# Patient Record
Sex: Male | Born: 1960
Health system: Southern US, Community
[De-identification: ages and names within clinical notes are randomized; demographics above are authoritative.]

## PROBLEM LIST (undated history)

## (undated) DIAGNOSIS — J449 Chronic obstructive pulmonary disease, unspecified: Secondary | ICD-10-CM

## (undated) DIAGNOSIS — IMO0001 Reserved for inherently not codable concepts without codable children: Secondary | ICD-10-CM

## (undated) DIAGNOSIS — J45909 Unspecified asthma, uncomplicated: Secondary | ICD-10-CM

## (undated) DIAGNOSIS — L739 Follicular disorder, unspecified: Secondary | ICD-10-CM

## (undated) DIAGNOSIS — R7303 Prediabetes: Secondary | ICD-10-CM

## (undated) HISTORY — DX: Prediabetes: R73.03

## (undated) HISTORY — DX: Follicular disorder, unspecified: L73.9

## (undated) HISTORY — PX: CHOLECYSTECTOMY: SHX55

---

## 2005-04-15 ENCOUNTER — Emergency Department (HOSPITAL_COMMUNITY): Admission: EM | Admit: 2005-04-15 | Discharge: 2005-04-15 | Payer: Self-pay | Admitting: Emergency Medicine

## 2006-11-11 ENCOUNTER — Emergency Department (HOSPITAL_COMMUNITY): Admission: EM | Admit: 2006-11-11 | Discharge: 2006-11-11 | Payer: Self-pay | Admitting: Emergency Medicine

## 2006-11-11 ENCOUNTER — Ambulatory Visit (HOSPITAL_COMMUNITY): Admission: RE | Admit: 2006-11-11 | Discharge: 2006-11-11 | Payer: Self-pay | Admitting: Emergency Medicine

## 2011-05-28 ENCOUNTER — Encounter (HOSPITAL_COMMUNITY): Payer: Self-pay | Admitting: Radiology

## 2011-05-28 ENCOUNTER — Emergency Department (HOSPITAL_COMMUNITY)
Admission: EM | Admit: 2011-05-28 | Discharge: 2011-05-28 | Disposition: A | Discharge status: Home / Self Care | Payer: BC Managed Care – PPO | Attending: Emergency Medicine | Admitting: Emergency Medicine

## 2011-05-28 ENCOUNTER — Emergency Department (HOSPITAL_COMMUNITY): Payer: BC Managed Care – PPO

## 2011-05-28 DIAGNOSIS — K429 Umbilical hernia without obstruction or gangrene: Secondary | ICD-10-CM | POA: Insufficient documentation

## 2011-05-28 DIAGNOSIS — K5289 Other specified noninfective gastroenteritis and colitis: Secondary | ICD-10-CM | POA: Insufficient documentation

## 2011-05-28 DIAGNOSIS — R9389 Abnormal findings on diagnostic imaging of other specified body structures: Secondary | ICD-10-CM | POA: Insufficient documentation

## 2011-05-28 DIAGNOSIS — R1033 Periumbilical pain: Secondary | ICD-10-CM | POA: Insufficient documentation

## 2011-05-28 DIAGNOSIS — R112 Nausea with vomiting, unspecified: Secondary | ICD-10-CM | POA: Insufficient documentation

## 2011-05-28 LAB — CBC
HCT: 47.7 % (ref 39.0–52.0)
MCH: 33 pg (ref 26.0–34.0)
MCHC: 36.7 g/dL — ABNORMAL HIGH (ref 30.0–36.0)
MCV: 90 fL (ref 78.0–100.0)
RBC: 5.3 MIL/uL (ref 4.22–5.81)
WBC: 8.4 10*3/uL (ref 4.0–10.5)

## 2011-05-28 LAB — POCT I-STAT, CHEM 8
BUN: 12 mg/dL (ref 6–23)
Calcium, Ion: 1.13 mmol/L (ref 1.12–1.32)
Chloride: 104 mEq/L (ref 96–112)
TCO2: 24 mmol/L (ref 0–100)

## 2011-05-28 LAB — DIFFERENTIAL
Eosinophils Absolute: 0 10*3/uL (ref 0.0–0.7)
Eosinophils Relative: 0 % (ref 0–5)
Monocytes Absolute: 0.4 10*3/uL (ref 0.1–1.0)
Monocytes Relative: 4 % (ref 3–12)
Neutro Abs: 6.6 10*3/uL (ref 1.7–7.7)
Neutrophils Relative %: 78 % — ABNORMAL HIGH (ref 43–77)

## 2011-05-28 LAB — URINALYSIS, ROUTINE W REFLEX MICROSCOPIC
Glucose, UA: NEGATIVE mg/dL
Leukocytes, UA: NEGATIVE
Nitrite: NEGATIVE
Specific Gravity, Urine: 1.024 (ref 1.005–1.030)

## 2011-05-28 MED ORDER — IOHEXOL 300 MG/ML  SOLN
100.0000 mL | Freq: Once | INTRAMUSCULAR | Status: AC | PRN
  Administered 2011-05-28: 100 mL via INTRAVENOUS

## 2012-07-29 IMAGING — CT CT ABD-PELV W/ CM
2 of 5 series · 16 of 46 positions shown, 18 images · IV contrast (APPLIED)
Comparison: None.

***ADDENDUM*** CREATED: 05/28/2011 [DATE]

The original report was by Dr. Luis Aroldo Salameh.  The following
addendum is by Dr. Luis Aroldo Salameh:
Please note that in the fourth paragraph under the findings
section, the word "unlikely" was mistranscribed and should read
"likely."
Accordingly, that first sentence should read, "An enhancing left
mid-kidney mass measures 2.6 x 2.4 x 1.9 cm and is highly LIKELY to
represent a renal cell carcinoma."
ED personnel are aware.
CLINICAL DATA: Abdominal pain.  Nausea and green emesis.
CT ABDOMEN AND PELVIS WITH CONTRAST
TECHNIQUE: Multidetector CT imaging of the abdomen and pelvis was
performed following the standard protocol during bolus
administration of intravenous contrast.
Contrast: 100 ml 1mnipaque-3SS

[Series 2: abd/pelv with 5.0 b31f st · axial · 0.75mm/px · z∈[-456,-10]mm · 13 of 99 slices shown, 15 images]
[im 5/99  soft-tissue]
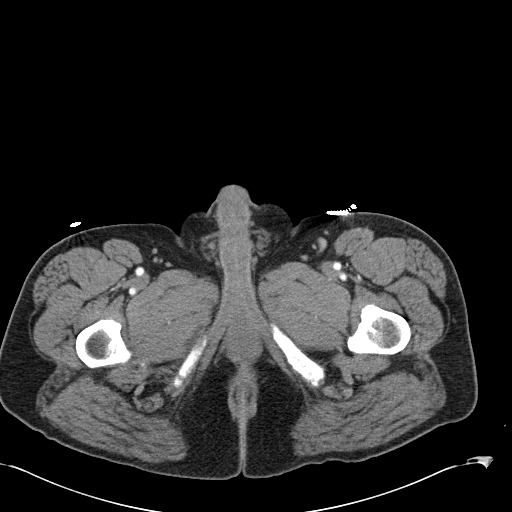
[im 5/99  bone]
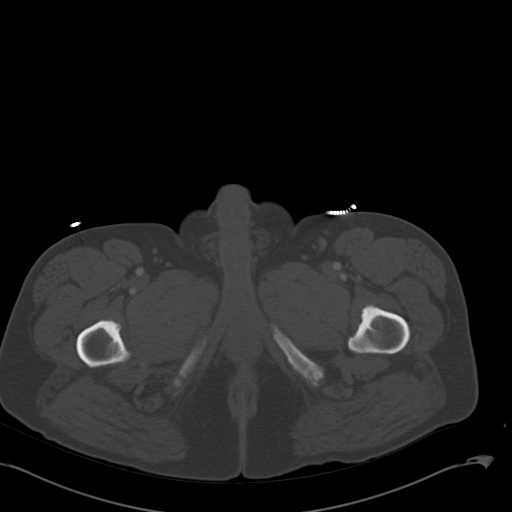
[im 15/99  soft-tissue]
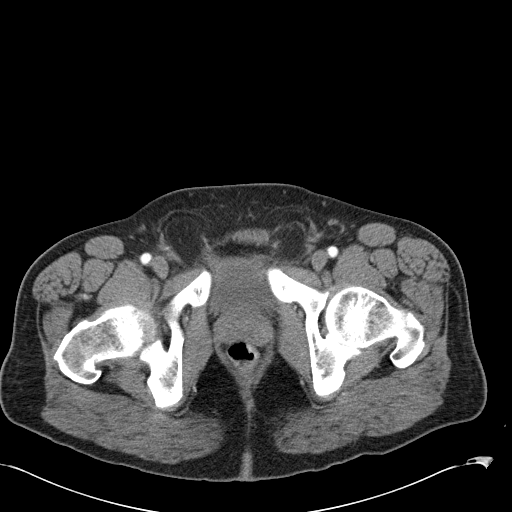
[im 20/99  soft-tissue]
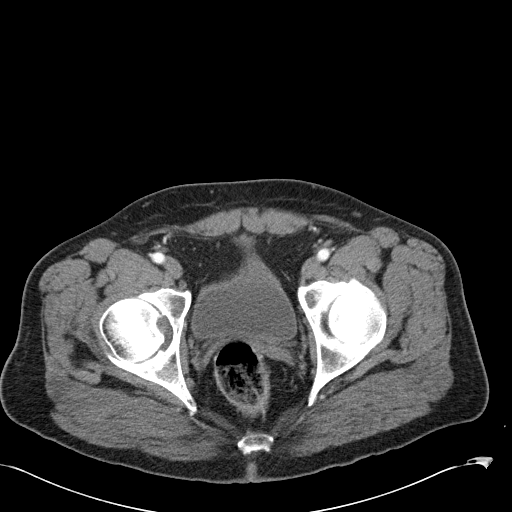
[im 30/99  soft-tissue]
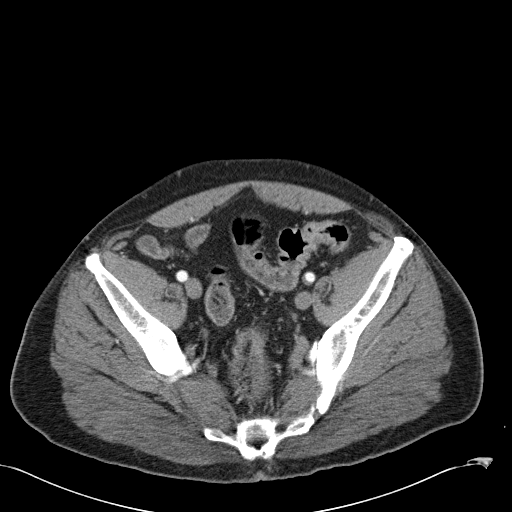
[im 35/99  soft-tissue]
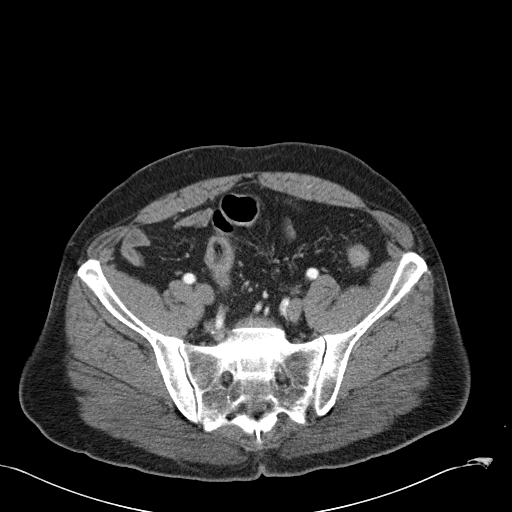
[im 45/99  soft-tissue]
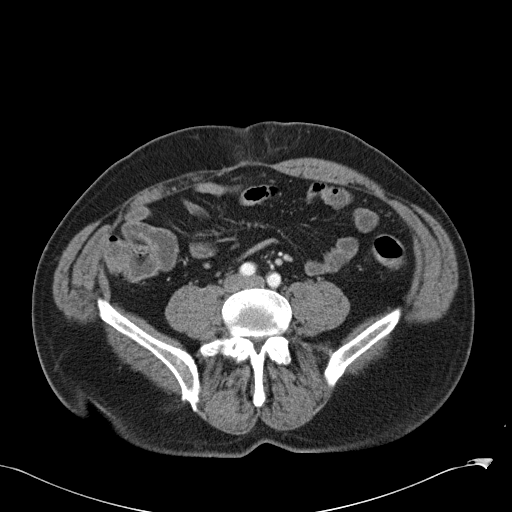
[im 50/99  soft-tissue]
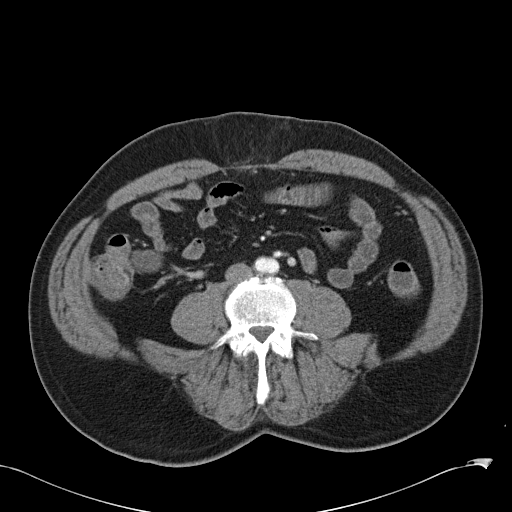
[im 54/99  soft-tissue]
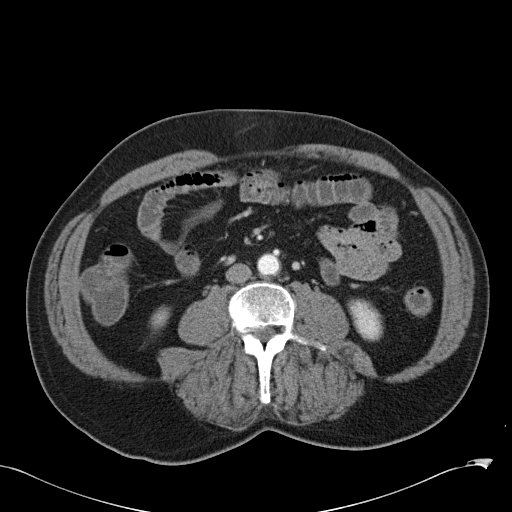
[im 64/99  soft-tissue]
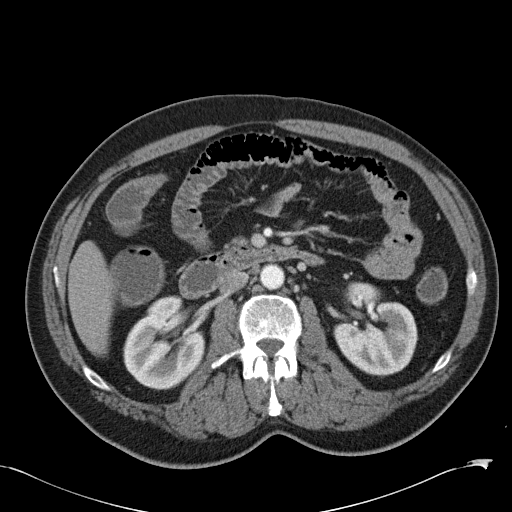
[im 64/99  bone]
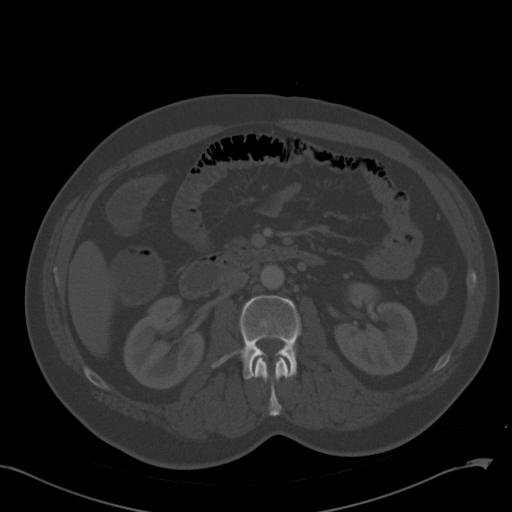
[im 69/99  soft-tissue]
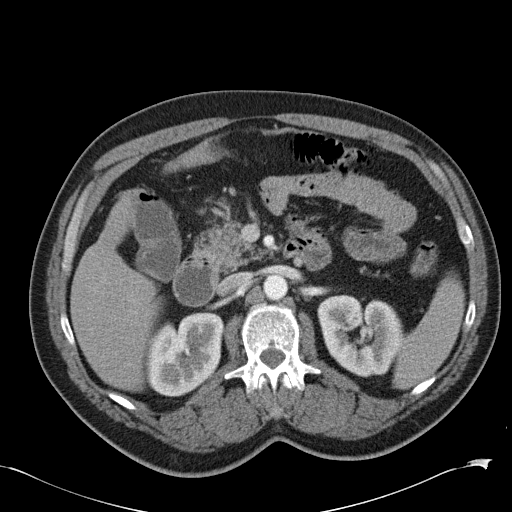
[im 79/99  soft-tissue]
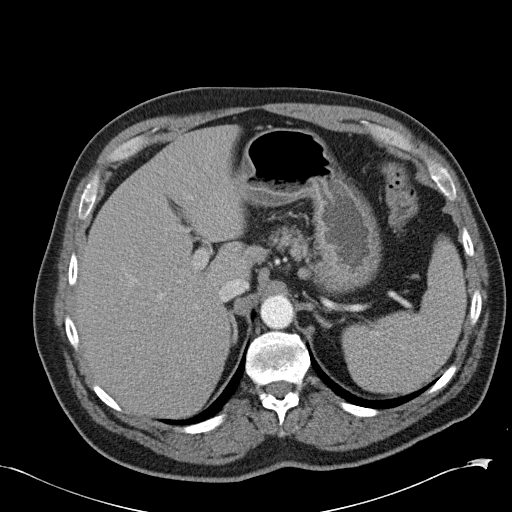
[im 84/99  soft-tissue]
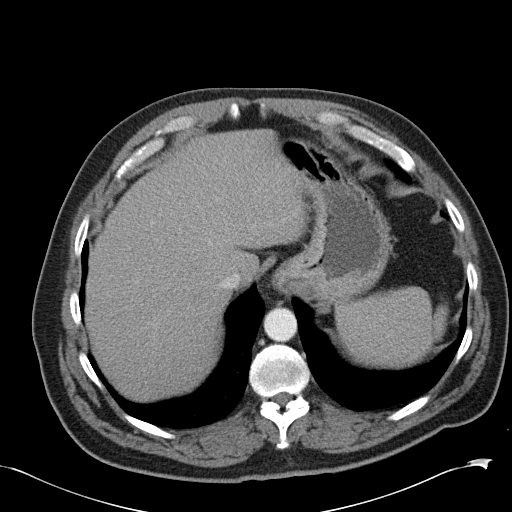
[im 94/99  soft-tissue]
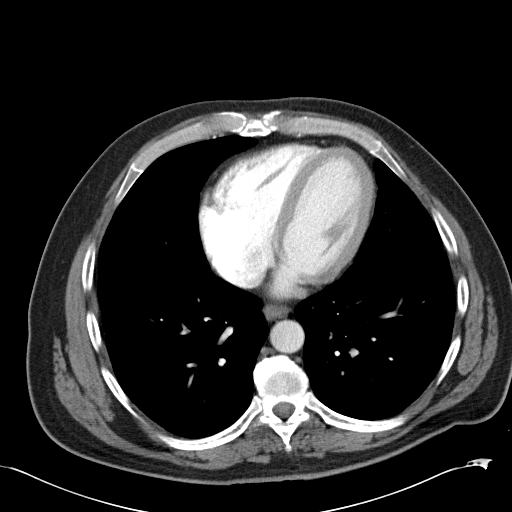

[Series 5: abd/pelv with 2.0 cor · coronal · 0.96mm/px · 3 of 127 slices shown]
[im 43/127  soft-tissue]
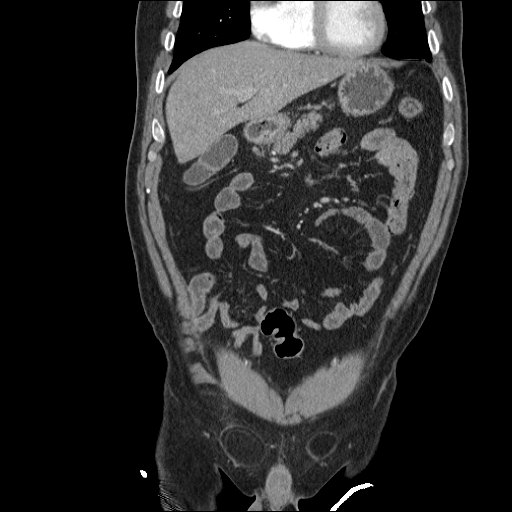
[im 57/127  soft-tissue]
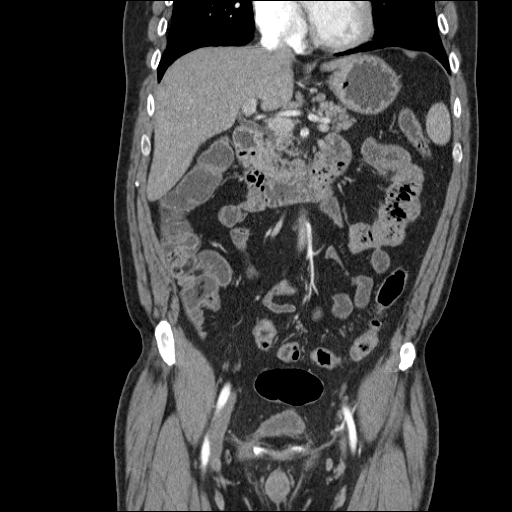
[im 71/127  soft-tissue]
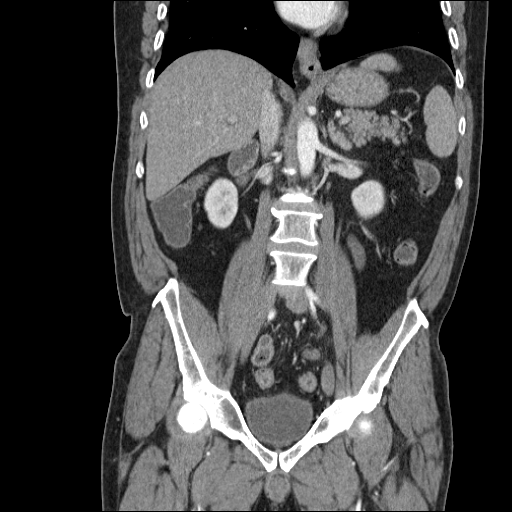

[16 of 46 positions shown; findings below may reference images not displayed]

FINDINGS: On the top most image there is a subtle suggestion of a 5
mm nodule in the right lower lobe.  There is also a suggestion of
faint tree in bud nodularity in the lower lobes favoring an
inflammatory etiology.

The liver, spleen, pancreas, and adrenal glands appear
unremarkable.

The gallbladder is surgically absent.

An enhancing left mid-kidney mass measures 2.6 x 2.4 by 1.9 cm and
is highly unlikely to represent a renal cell carcinoma. No
extension beyond the renal capsule is identified.  No pathologic
retroperitoneal adenopathy or tumor thrombus in the left renal vein
is identified.

The right kidney appears unremarkable.

A small gastrohepatic ligament node measures 0.9 cm on image 20 of
series 2.

A 6.5 cm supraumbilical hernia contains adipose tissue in the
midline.

Bilateral inguinal hernias contain adipose tissue.

No pathologic pelvic adenopathy is identified.

Urinary bladder appears unremarkable.  Mild proximal jejunal
dilatation is present, with scattered air-fluid levels and proximal
small bowel loops suggesting enteritis.

Posterior intervertebral spurring at all levels between L2 and L5
noted.
IMPRESSION: 1.  The finding of greatest concern is a enhancing left mid kidney
mass highly likely to represent stage I renal cell carcinoma.
Urology referral is recommended.
2.  There is some faint tree in bud nodularity in the lower lobes
favoring atypical infectious bronchiolitis.  I suspect that the 5
mm nodule partially visualized in the right lower lobe on the top
most image likely is also inflammatory, but given the renal lesion,
short-term follow-up CT the chest may be warranted.
3.  6.5 cm ventral hernia contains adipose tissue.
4.  Bilateral inguinal hernias also contain adipose tissue.
5.  Mild proximal small bowel dilatation with some mild mucosal
enhancement in the proximal small bowel favoring enteritis.

I discussed these findings, and in particular the suspected left
renal malignancy, with Dr. Stepan Lenard by telephone on 05/28/2011
at [DATE] p.m.

## 2013-09-10 ENCOUNTER — Encounter (HOSPITAL_COMMUNITY): Payer: Self-pay | Admitting: Emergency Medicine

## 2013-09-10 ENCOUNTER — Emergency Department (HOSPITAL_COMMUNITY)
Admission: EM | Admit: 2013-09-10 | Discharge: 2013-09-10 | Disposition: A | Discharge status: Home / Self Care | Payer: Self-pay | Attending: Emergency Medicine | Admitting: Emergency Medicine

## 2013-09-10 ENCOUNTER — Emergency Department (HOSPITAL_COMMUNITY): Payer: Self-pay

## 2013-09-10 DIAGNOSIS — Z79899 Other long term (current) drug therapy: Secondary | ICD-10-CM | POA: Insufficient documentation

## 2013-09-10 DIAGNOSIS — R05 Cough: Secondary | ICD-10-CM

## 2013-09-10 DIAGNOSIS — J209 Acute bronchitis, unspecified: Secondary | ICD-10-CM | POA: Insufficient documentation

## 2013-09-10 DIAGNOSIS — J40 Bronchitis, not specified as acute or chronic: Secondary | ICD-10-CM

## 2013-09-10 MED ORDER — AZITHROMYCIN 250 MG PO TABS
500.0000 mg | ORAL_TABLET | Freq: Once | ORAL | Status: AC
  Administered 2013-09-10: 500 mg via ORAL
  Filled 2013-09-10: qty 2

## 2013-09-10 MED ORDER — AZITHROMYCIN 250 MG PO TABS: 250.0000 mg | ORAL_TABLET | Freq: Every day | ORAL | Status: DC

## 2013-09-10 MED ORDER — ALBUTEROL SULFATE HFA 108 (90 BASE) MCG/ACT IN AERS
2.0000 | INHALATION_SPRAY | RESPIRATORY_TRACT | Status: DC | PRN
  Administered 2013-09-10: 2 via RESPIRATORY_TRACT
  Filled 2013-09-10: qty 6.7

## 2013-09-10 MED ORDER — IBUPROFEN 800 MG PO TABS
800.0000 mg | ORAL_TABLET | Freq: Once | ORAL | Status: AC
  Administered 2013-09-10: 800 mg via ORAL
  Filled 2013-09-10: qty 1

## 2013-09-10 MED ORDER — ALBUTEROL SULFATE (5 MG/ML) 0.5% IN NEBU
5.0000 mg | INHALATION_SOLUTION | Freq: Once | RESPIRATORY_TRACT | Status: AC
  Administered 2013-09-10: 5 mg via RESPIRATORY_TRACT
  Filled 2013-09-10: qty 1

## 2013-09-10 NOTE — ED Notes (Signed)
Patient alert, doing breathing treatment.  Skin pink, wm, dry.  + cough.  CXR shows no abnormalities.

## 2013-09-10 NOTE — ED Provider Notes (Signed)
CSN: [id number]     Arrival date & time 09/10/13  0018 History   First MD Initiated Contact with Patient 09/10/13 0034     Chief Complaint  Patient presents with  . cold  cough    HPI  History provided by the patient and significant other. Patient is a 52 year old male with history of smoking who presents with complaints of continued cough and cold symptoms. Symptoms first began 1-2 weeks ago with fairly dry cough. He does report some nasal congestion and rhinorrhea. Coughing has been persistent. Other family members have also had similar symptoms but have felt better. His symptoms have continued with also reported fevers and chills the past few days. He has been using over-the-counter cough and cold medicine without any improvement. Denies any associated chest pain. Denies any significant productive cough or hemoptysis. No vomiting or diarrhea. No other aggravating or alleviating factors. No other associated symptoms.    History reviewed. No pertinent past medical history. History reviewed. No pertinent past surgical history. No family history on file. History  Substance Use Topics  . Smoking status: Never Smoker   . Smokeless tobacco: Not on file  . Alcohol Use: No    Review of Systems  Constitutional: Positive for fever and chills.  HENT: Positive for congestion and rhinorrhea.   Respiratory: Positive for cough and shortness of breath.   Cardiovascular: Negative for chest pain.  Gastrointestinal: Negative for vomiting, abdominal pain and diarrhea.  All other systems reviewed and are negative.    Allergies  Review of patient's allergies indicates no known allergies.  Home Medications   Current Outpatient Rx  Name  Route  Sig  Dispense  Refill  . Nutritional Supplements (COLD AND FLU PO)   Oral   Take 30 mLs by mouth daily.          BP 127/87  Pulse 68  Temp(Src) 98.3 F (36.8 C)  Resp 20  Ht 5\' 7"  (1.702 m)  Wt 195 lb (88.451 kg)  BMI 30.53 kg/m2  SpO2  92% Physical Exam  Nursing note and vitals reviewed. Constitutional: He is oriented to person, place, and time. He appears well-developed and well-nourished. No distress.  HENT:  Head: Normocephalic and atraumatic.  Eyes: Conjunctivae are normal.  Neck: Normal range of motion. Neck supple.  No meningeal signs.  Cardiovascular: Normal rate and regular rhythm.   Pulmonary/Chest: Effort normal. No respiratory distress. He has no wheezes. He has rales.  Mild rales in bases  Abdominal: Soft. There is no tenderness. There is no rebound and no guarding.  Neurological: He is alert and oriented to person, place, and time.  Skin: Skin is warm.  Psychiatric: He has a normal mood and affect. His behavior is normal.    ED Course  Procedures  DIAGNOSTIC STUDIES: Oxygen Saturation is 94% on room air.    COORDINATION OF CARE:  Nursing notes reviewed. Vital signs reviewed. Initial pt interview and examination performed.   12:42 AM-patient seen and evaluated. Patient resting appears comfortable with occasional coughing. Does not appear in acute distress. Discussed work up plan with pt at bedside, which includes chest x-ray . Pt agrees with plan.  Patient reports having some improvement of his cough and tightness in chest after albuterol. States he feels he can bring up more mucus. At this time we'll provide albuterol inhaler to take home. X-rays without any concerning findings. Discussed continued treatment with patient and he agrees.  Treatment plan initiated: Medications  albuterol (PROVENTIL HFA;VENTOLIN HFA) 108 (  90 BASE) MCG/ACT inhaler 2 puff (2 puffs Inhalation Given 09/10/13 0148)  albuterol (PROVENTIL) (5 MG/ML) 0.5% nebulizer solution 5 mg (5 mg Nebulization Given 09/10/13 0102)  ibuprofen (ADVIL,MOTRIN) tablet 800 mg (800 mg Oral Given 09/10/13 0115)  azithromycin (ZITHROMAX) tablet 500 mg (500 mg Oral Given 09/10/13 0147)    Imaging Review Dg Chest 2 View  09/10/2013   CLINICAL  DATA:  Cough, fever  EXAM: CHEST  2 VIEW  COMPARISON:  11/11/2006  FINDINGS: Normal heart size, mediastinal contours, and pulmonary vascularity.  Lungs clear.  Bones unremarkable.  No pneumothorax.  IMPRESSION: Normal exam.   Electronically Signed   By: Ulyses Southward M.D.   On: 09/10/2013 01:08     MDM   1. Cough   2. Bronchitis        Angus Seller, PA-C 09/10/13 0345

## 2013-09-10 NOTE — ED Provider Notes (Signed)
Medical screening examination/treatment/procedure(s) were performed by non-physician practitioner and as supervising physician I was immediately available for consultation/collaboration.  EKG Interpretation   None         David Masneri, MD 09/10/13 0550 

## 2013-09-10 NOTE — ED Notes (Signed)
The pt has had a cough and a cold for 2 weeks and it is not getting any better.  He also has had a temp

## 2014-01-29 ENCOUNTER — Encounter (HOSPITAL_COMMUNITY): Payer: Self-pay | Admitting: Emergency Medicine

## 2014-01-29 ENCOUNTER — Emergency Department (HOSPITAL_COMMUNITY)
Admission: EM | Admit: 2014-01-29 | Discharge: 2014-01-29 | Disposition: A | Discharge status: Home / Self Care | Payer: BC Managed Care – PPO | Attending: Emergency Medicine | Admitting: Emergency Medicine

## 2014-01-29 DIAGNOSIS — M546 Pain in thoracic spine: Secondary | ICD-10-CM | POA: Insufficient documentation

## 2014-01-29 DIAGNOSIS — F17200 Nicotine dependence, unspecified, uncomplicated: Secondary | ICD-10-CM | POA: Insufficient documentation

## 2014-01-29 NOTE — ED Notes (Signed)
Pt and friend upset they have waited so long to see a Dr. Rock NephewPt was encouraged to stay, pt refused. Pt and friend abruptly left the room and quickly walked to the ED exit with steady gait and in no apparent distress.

## 2014-01-29 NOTE — ED Notes (Signed)
Pt. reports right upper muscle back strain / pain after heavy lifting at work [employer]( [employer] ) last Wednesday . Respirations unlabored / pain worse with movement .

## 2014-01-30 ENCOUNTER — Encounter (HOSPITAL_COMMUNITY): Payer: Self-pay | Admitting: Emergency Medicine

## 2014-01-30 ENCOUNTER — Emergency Department (INDEPENDENT_AMBULATORY_CARE_PROVIDER_SITE_OTHER)
Admission: EM | Admit: 2014-01-30 | Discharge: 2014-01-30 | Disposition: A | Discharge status: Home / Self Care | Payer: Self-pay | Source: Home / Self Care

## 2014-01-30 DIAGNOSIS — IMO0002 Reserved for concepts with insufficient information to code with codable children: Secondary | ICD-10-CM

## 2014-01-30 DIAGNOSIS — X58XXXA Exposure to other specified factors, initial encounter: Secondary | ICD-10-CM

## 2014-01-30 DIAGNOSIS — S46911A Strain of unspecified muscle, fascia and tendon at shoulder and upper arm level, right arm, initial encounter: Secondary | ICD-10-CM

## 2014-01-30 MED ORDER — TRAMADOL HCL 50 MG PO TABS: 50.0000 mg | ORAL_TABLET | Freq: Four times a day (QID) | ORAL | Status: DC | PRN

## 2014-01-30 MED ORDER — DICLOFENAC SODIUM 1 % TD GEL: 1.0000 "application " | Freq: Four times a day (QID) | TRANSDERMAL | Status: DC

## 2014-01-30 NOTE — ED Provider Notes (Signed)
Medical screening examination/treatment/procedure(s) were performed by resident physician or non-physician practitioner and as supervising physician I was immediately available for consultation/collaboration.   KINDL,JAMES DOUGLAS MD.   James D Kindl, MD 01/30/14 2142 

## 2014-01-30 NOTE — ED Notes (Signed)
C/o right shoulder pain since 4/29.  Pt states that he does a lot of heavy lifting.  Denies any known injury.  Mild relief with muscle rub and otc pain meds.

## 2014-01-30 NOTE — ED Provider Notes (Signed)
CSN: [id number]     Arrival date & time 01/30/14  1516 History   First MD Initiated Contact with Patient 01/30/14 1543     Chief Complaint  Patient presents with  . Shoulder Pain   (Consider location/radiation/quality/duration/timing/severity/associated sxs/prior Treatment) HPI Comments: Job requires lifting heavy objects and 1 week ago experienced sudden pain in the right upper back, scapular area while lifting.  He has continued to work since the injury   History reviewed. No pertinent past medical history. History reviewed. No pertinent past surgical history. History reviewed. No pertinent family history. History  Substance Use Topics  . Smoking status: Current Every Day Smoker -- 0.50 packs/day    Types: Cigarettes  . Smokeless tobacco: Not on file  . Alcohol Use: No    Review of Systems  Constitutional: Negative.   Respiratory: Negative.   Gastrointestinal: Negative.   Genitourinary: Negative.   Musculoskeletal: Positive for myalgias.       As per HPI  Skin: Negative.   Neurological: Negative for dizziness, weakness, numbness and headaches.    Allergies  Review of patient's allergies indicates no known allergies.  Home Medications   Prior to Admission medications   Not on File   BP 152/61  Pulse 66  Temp(Src) 98.6 F (37 C) (Oral)  Resp 18  SpO2 100% Physical Exam  Nursing note and vitals reviewed. Constitutional: He is oriented to person, place, and time. He appears well-developed and well-nourished.  HENT:  Head: Normocephalic and atraumatic.  Eyes: EOM are normal.  Neck: Normal range of motion. Neck supple.  Musculoskeletal:  Full ROM of R arm/shoulder Tenderness over the supra and infraspinatus musculature, rhomboid and latissimus dorsi. No sweling or discoloration.  Neurological: He is alert and oriented to person, place, and time. No cranial nerve deficit.  Skin: Skin is warm and dry.  Psychiatric: He has a normal mood and affect.    ED Course   Procedures (including critical care time) Labs Review Labs Reviewed - No data to display  Imaging Review No results found.   MDM   1. Muscle strain of right scapular region    Diclofenac gel Heat trmadol Limit lifting, no heavy lifting.      Hayden Rasmussenavid Mira Balon, NP 01/30/14 [phone]

## 2014-01-30 NOTE — Discharge Instructions (Signed)
Muscle Strain  A muscle strain (pulled muscle) happens when a muscle is stretched beyond normal length. It happens when a sudden, violent force stretches your muscle too far. Usually, a few of the fibers in your muscle are torn. Muscle strain is common in athletes. Recovery usually takes 1 2 weeks. Complete healing takes 5 6 weeks.   HOME CARE    Follow the PRICE method of treatment to help your injury get better. Do this the first 2 3 days after the injury:   Protect. Protect the muscle to keep it from getting injured again.   Rest. Limit your activity and rest the injured body part.   Ice. Put ice in a plastic bag. Place a towel between your skin and the bag. Then, apply the ice and leave it on from 15 20 minutes each hour. After the third day, switch to moist heat packs.   Compression. Use a splint or elastic bandage on the injured area for comfort. Do not put it on too tightly.   Elevate. Keep the injured body part above the level of your heart.   Only take medicine as told by your doctor.   Warm up before doing exercise to prevent future muscle strains.  GET HELP IF:    You have more pain or puffiness (swelling) in the injured area.   You feel numbness, tingling, or notice a loss of strength in the injured area.  MAKE SURE YOU:    Understand these instructions.   Will watch your condition.   Will get help right away if you are not doing well or get worse.  Document Released: 06/22/2008 Document Revised: 07/04/2013 Document Reviewed: 04/12/2013  [employer] Patient Information 2014 [employer].

## 2014-02-15 ENCOUNTER — Ambulatory Visit: Payer: Self-pay | Attending: Internal Medicine

## 2014-05-28 ENCOUNTER — Encounter (HOSPITAL_COMMUNITY): Payer: Self-pay | Admitting: Emergency Medicine

## 2014-05-28 ENCOUNTER — Emergency Department (HOSPITAL_COMMUNITY): Payer: Self-pay

## 2014-05-28 ENCOUNTER — Emergency Department (HOSPITAL_COMMUNITY)
Admission: EM | Admit: 2014-05-28 | Discharge: 2014-05-28 | Disposition: A | Discharge status: Home / Self Care | Payer: Self-pay | Attending: Emergency Medicine | Admitting: Emergency Medicine

## 2014-05-28 DIAGNOSIS — R059 Cough, unspecified: Secondary | ICD-10-CM | POA: Insufficient documentation

## 2014-05-28 DIAGNOSIS — J069 Acute upper respiratory infection, unspecified: Secondary | ICD-10-CM | POA: Insufficient documentation

## 2014-05-28 DIAGNOSIS — F17200 Nicotine dependence, unspecified, uncomplicated: Secondary | ICD-10-CM | POA: Insufficient documentation

## 2014-05-28 DIAGNOSIS — IMO0002 Reserved for concepts with insufficient information to code with codable children: Secondary | ICD-10-CM | POA: Insufficient documentation

## 2014-05-28 DIAGNOSIS — R05 Cough: Secondary | ICD-10-CM | POA: Insufficient documentation

## 2014-05-28 MED ORDER — HYDROCODONE-HOMATROPINE 5-1.5 MG/5ML PO SYRP: 5.0000 mL | ORAL_SOLUTION | Freq: Four times a day (QID) | ORAL | Status: DC | PRN

## 2014-05-28 MED ORDER — GUAIFENESIN 100 MG/5ML PO LIQD: 100.0000 mg | ORAL | Status: DC | PRN

## 2014-05-28 NOTE — ED Notes (Addendum)
Pt reports productive cough with green sputum x 2 days. Denies SOB, denies CP, denies fever/chills. Hx of bronchitis, states this feels the same. Pt AO x4.

## 2014-05-28 NOTE — ED Provider Notes (Signed)
CSN: [id number]     Arrival date & time 05/28/14  1501 History  This chart was scribed for non-physician practitioner, Fayrene Helper, PA-C working with Doug Sou, MD by Luisa Dago, ED scribe. This patient was seen in room TR11C/TR11C and the patient's care was started at 4:17 PM.    Chief Complaint  Patient presents with  . URI    The history is provided by the patient. No language interpreter was used.   HPI Comments: Mathew Bridges is a 53 y.o. male who presents to the Emergency Department complaining of bronchitis. Pt states that his current symptoms are similar to his previous episode of bronchitis, which was 4 months ago. He is complaining of associated productive cough with green sputum and congestion that started approximately 3 days ago. [name] does report one sick contact that was diagnosed with bronchitis. Pt is a smoker, he reports smoking about 1 pack a day. Denies any known medicinal allergies. He denies any rhinorrhea, fever, chills, myalgia, headaches, chest pain, SOB, or sore throat.   History reviewed. No pertinent past medical history. History reviewed. No pertinent past surgical history. No family history on file. History  Substance Use Topics  . Smoking status: Current Every Day Smoker -- 0.50 packs/day    Types: Cigarettes  . Smokeless tobacco: Not on file  . Alcohol Use: No    Review of Systems  Constitutional: Negative for fever.  HENT: Positive for congestion. Negative for sore throat.   Respiratory: Positive for cough. Negative for chest tightness and shortness of breath.   Cardiovascular: Negative for chest pain and leg swelling.  Gastrointestinal: Negative for nausea, vomiting and abdominal pain.  Neurological: Negative for dizziness, light-headedness and headaches.    Allergies  Review of patient's allergies indicates no known allergies.  Home Medications   Prior to Admission medications   Medication Sig Start Date End Date Taking?  Authorizing Provider  diclofenac sodium (VOLTAREN) 1 % GEL Apply 1 application topically 4 (four) times daily. 01/30/14   Hayden Rasmussen, NP  traMADol (ULTRAM) 50 MG tablet Take 1 tablet (50 mg total) by mouth every 6 (six) hours as needed. 01/30/14   Hayden Rasmussen, NP   BP 121/82  Pulse 89  Temp(Src) 98.8 F (37.1 C) (Oral)  Resp 18  Ht  (1.676 m)  Wt 181 lb (82.101 kg)  BMI 29.23 kg/m2  SpO2 96%  Physical Exam  Nursing note and vitals reviewed. Constitutional: He is oriented to person, place, and time. He appears well-developed and well-nourished. No distress.  HENT:  Head: Normocephalic and atraumatic.  Cerumen impaction in the right ear. Normal TM in left ear. Oral mucosa is moist. No tonsillar enlargement.   Eyes: Conjunctivae and EOM are normal.  Neck: Neck supple.  Cardiovascular: Normal rate and regular rhythm.  Exam reveals no gallop and no friction rub.   No murmur heard. Pulmonary/Chest: Effort normal. No respiratory distress. He has no wheezes. He has no rales.  Musculoskeletal: Normal range of motion.  No pedal edema.  Neurological: He is alert and oriented to person, place, and time.  Skin: Skin is warm and dry.  Psychiatric: He has a normal mood and affect. His behavior is normal.    ED Course  Procedures (including critical care time)  DIAGNOSTIC STUDIES: Oxygen Saturation is 96% on RA, adequate by my interpretation.    COORDINATION OF CARE: 4:22 PM-URI sxs. Normal cxr. Pt was advised of his chest X-ray results. Will prescribe medication to alleviate his  symptoms. Pt advised of plan for treatment and pt agrees.  Imaging Review Dg Chest 2 View  05/28/2014   CLINICAL DATA:  Cough.  EXAM: CHEST  2 VIEW  COMPARISON:  September 10, 2013.  FINDINGS: The heart size and mediastinal contours are within normal limits. Both lungs are clear. No pneumothorax or pleural effusion is noted. The visualized skeletal structures are unremarkable.  IMPRESSION: No acute cardiopulmonary  abnormality seen.   Electronically Signed   By: Roque Lias M.D.   On: 05/28/2014 16:01    MDM   Final diagnoses:  URI (upper respiratory infection)    BP 121/67  Pulse 71  Temp(Src) 98.8 F (37.1 C) (Oral)  Resp 18  Ht  (1.676 m)  Wt 181 lb (82.101 kg)  BMI 29.23 kg/m2  SpO2 96%   I personally performed the services described in this documentation, which was scribed in my presence. The recorded information has been reviewed and is accurate.    Fayrene Helper, PA-C 05/28/14 1639

## 2014-05-28 NOTE — Discharge Instructions (Signed)
Upper Respiratory Infection, Adult An upper respiratory infection (URI) is also sometimes known as the common cold. The upper respiratory tract includes the nose, sinuses, throat, trachea, and bronchi. Bronchi are the airways leading to the lungs. Most people improve within 1 week, but symptoms can last up to 2 weeks. A residual cough may last even longer.  CAUSES Many different viruses can infect the tissues lining the upper respiratory tract. The tissues become irritated and inflamed and often become very moist. Mucus production is also common. A cold is contagious. You can easily spread the virus to others by oral contact. This includes kissing, sharing a glass, coughing, or sneezing. Touching your mouth or nose and then touching a surface, which is then touched by another person, can also spread the virus. SYMPTOMS  Symptoms typically develop 1 to 3 days after you come in contact with a cold virus. Symptoms vary from person to person. They may include:  Runny nose.  Sneezing.  Nasal congestion.  Sinus irritation.  Sore throat.  Loss of voice (laryngitis).  Cough.  Fatigue.  Muscle aches.  Loss of appetite.  Headache.  Low-grade fever. DIAGNOSIS  You might diagnose your own cold based on familiar symptoms, since most people get a cold 2 to 3 times a year. Your caregiver can confirm this based on your exam. Most importantly, your caregiver can check that your symptoms are not due to another disease such as strep throat, sinusitis, pneumonia, asthma, or epiglottitis. Blood tests, throat tests, and X-rays are not necessary to diagnose a common cold, but they may sometimes be helpful in excluding other more serious diseases. Your caregiver will decide if any further tests are required. RISKS AND COMPLICATIONS  You may be at risk for a more severe case of the common cold if you smoke cigarettes, have chronic heart disease (such as heart failure) or lung disease (such as asthma), or if  you have a weakened immune system. The very young and very old are also at risk for more serious infections. Bacterial sinusitis, middle ear infections, and bacterial pneumonia can complicate the common cold. The common cold can worsen asthma and chronic obstructive pulmonary disease (COPD). Sometimes, these complications can require emergency medical care and may be life-threatening. PREVENTION  The best way to protect against getting a cold is to practice good hygiene. Avoid oral or hand contact with people with cold symptoms. Wash your hands often if contact occurs. There is no clear evidence that vitamin C, vitamin E, echinacea, or exercise reduces the chance of developing a cold. However, it is always recommended to get plenty of rest and practice good nutrition. TREATMENT  Treatment is directed at relieving symptoms. There is no cure. Antibiotics are not effective, because the infection is caused by a virus, not by bacteria. Treatment may include:  Increased fluid intake. Sports drinks offer valuable electrolytes, sugars, and fluids.  Breathing heated mist or steam (vaporizer or shower).  Eating chicken soup or other clear broths, and maintaining good nutrition.  Getting plenty of rest.  Using gargles or lozenges for comfort.  Controlling fevers with ibuprofen or acetaminophen as directed by your caregiver.  Increasing usage of your inhaler if you have asthma. Zinc gel and zinc lozenges, taken in the first 24 hours of the common cold, can shorten the duration and lessen the severity of symptoms. Pain medicines may help with fever, muscle aches, and throat pain. A variety of non-prescription medicines are available to treat congestion and runny nose. Your caregiver   can make recommendations and may suggest nasal or lung inhalers for other symptoms.  HOME CARE INSTRUCTIONS   Only take over-the-counter or prescription medicines for pain, discomfort, or fever as directed by your  caregiver.  Use a warm mist humidifier or inhale steam from a shower to increase air moisture. This may keep secretions moist and make it easier to breathe.  Drink enough water and fluids to keep your urine clear or pale yellow.  Rest as needed.  Return to work when your temperature has returned to normal or as your caregiver advises. You may need to stay home longer to avoid infecting others. You can also use a face mask and careful hand washing to prevent spread of the virus. SEEK MEDICAL CARE IF:   After the first few days, you feel you are getting worse rather than better.  You need your caregiver's advice about medicines to control symptoms.  You develop chills, worsening shortness of breath, or brown or red sputum. These may be signs of pneumonia.  You develop yellow or brown nasal discharge or pain in the face, especially when you bend forward. These may be signs of sinusitis.  You develop a fever, swollen neck glands, pain with swallowing, or white areas in the back of your throat. These may be signs of strep throat. SEEK IMMEDIATE MEDICAL CARE IF:   You have a fever.  You develop severe or persistent headache, ear pain, sinus pain, or chest pain.  You develop wheezing, a prolonged cough, cough up blood, or have a change in your usual mucus (if you have chronic lung disease).  You develop sore muscles or a stiff neck. Document Released: 03/09/2001 Document Revised: 12/06/2011 Document Reviewed: 12/19/2013 Exit[employer] Patient Information 2015 [employer]. This information is not intended to replace advice given to you by your health care provider. Make sure you discuss any questions you have with your health care provider.  

## 2014-05-29 NOTE — ED Provider Notes (Signed)
Medical screening examination/treatment/procedure(s) were performed by non-physician practitioner and as supervising physician I was immediately available for consultation/collaboration.   EKG Interpretation None       Latonja Bobeck, MD 05/29/14 0144 

## 2014-11-29 ENCOUNTER — Emergency Department (HOSPITAL_COMMUNITY): Payer: 59

## 2014-11-29 ENCOUNTER — Emergency Department (HOSPITAL_COMMUNITY)
Admission: EM | Admit: 2014-11-29 | Discharge: 2014-11-29 | Disposition: A | Discharge status: Home / Self Care | Payer: 59 | Attending: Emergency Medicine | Admitting: Emergency Medicine

## 2014-11-29 ENCOUNTER — Encounter (HOSPITAL_COMMUNITY): Payer: Self-pay | Admitting: Family Medicine

## 2014-11-29 DIAGNOSIS — B9689 Other specified bacterial agents as the cause of diseases classified elsewhere: Secondary | ICD-10-CM | POA: Insufficient documentation

## 2014-11-29 DIAGNOSIS — R059 Cough, unspecified: Secondary | ICD-10-CM

## 2014-11-29 DIAGNOSIS — R05 Cough: Secondary | ICD-10-CM | POA: Diagnosis present

## 2014-11-29 DIAGNOSIS — J208 Acute bronchitis due to other specified organisms: Secondary | ICD-10-CM | POA: Diagnosis not present

## 2014-11-29 DIAGNOSIS — Z720 Tobacco use: Secondary | ICD-10-CM | POA: Insufficient documentation

## 2014-11-29 DIAGNOSIS — J209 Acute bronchitis, unspecified: Secondary | ICD-10-CM

## 2014-11-29 MED ORDER — ALBUTEROL SULFATE HFA 108 (90 BASE) MCG/ACT IN AERS: 1.0000 | INHALATION_SPRAY | Freq: Four times a day (QID) | RESPIRATORY_TRACT | Status: DC | PRN

## 2014-11-29 MED ORDER — AZITHROMYCIN 250 MG PO TABS: 250.0000 mg | ORAL_TABLET | Freq: Every day | ORAL | Status: DC

## 2014-11-29 MED ORDER — PREDNISONE 10 MG PO TABS: ORAL_TABLET | ORAL | Status: DC

## 2014-11-29 NOTE — Discharge Instructions (Signed)

## 2014-11-29 NOTE — ED Provider Notes (Signed)
CSN: [id number]     Arrival date & time 11/29/14  1450 History  This chart was scribed for non-physician practitioner, Langston MaskerKaren Sofia, PA-C, working with Arby BarretteMarcy Pfeiffer, MD by Charline BillsEssence Howell, ED Scribe. This patient was seen in room TR08C/TR08C and the patient's care was started at 3:45 PM.   Chief Complaint  Patient presents with  . Cough    The history is provided by the patient. No language interpreter was used.   HPI Comments: Mathew Bridges is a 54 y.o. male who presents to the Emergency Department complaining of persistent cough since yesterday. Pt reports associated intermittent chills. He denies fever. Pt states that his symptoms are similar to when he was diagnosed with bronchitis which he needed an inhaler for. Pt smokes 1.5 ppd; he has been a smoker since age 54. No PCP.   History reviewed. No pertinent past medical history. History reviewed. No pertinent past surgical history. History reviewed. No pertinent family history. History  Substance Use Topics  . Smoking status: Current Every Day Smoker -- 0.50 packs/day    Types: Cigarettes  . Smokeless tobacco: Not on file  . Alcohol Use: No    Review of Systems  Constitutional: Positive for chills. Negative for fever.  Respiratory: Positive for cough.    Allergies  Review of patient's allergies indicates no known allergies.  Home Medications   Prior to Admission medications   Medication Sig Start Date End Date Taking? Authorizing Provider  guaiFENesin (ROBITUSSIN) 100 MG/5ML liquid Take 5-10 mLs (100-200 mg total) by mouth every 4 (four) hours as needed for cough. 05/28/14   Fayrene HelperBowie Tran, PA-C  HYDROcodone-homatropine (HYCODAN) 5-1.5 MG/5ML syrup Take 5 mLs by mouth every 6 (six) hours as needed for cough. 05/28/14   Fayrene HelperBowie Tran, PA-C   BP 123/72 mmHg  Pulse 72  Temp(Src) 97.5 F (36.4 C)  Resp 18  SpO2 98% Physical Exam  Constitutional: He is oriented to person, place, and time. He appears well-developed and  well-nourished. No distress.  HENT:  Head: Normocephalic and atraumatic.  Eyes: Conjunctivae and EOM are normal.  Neck: Neck supple. No tracheal deviation present.  Cardiovascular: Normal rate.   Pulmonary/Chest: Effort normal. No respiratory distress. He has rhonchi (in all lobes).  Decreased breath sounds.   Musculoskeletal: Normal range of motion.  Neurological: He is alert and oriented to person, place, and time.  Skin: Skin is warm and dry.  Psychiatric: He has a normal mood and affect. His behavior is normal.  Nursing note and vitals reviewed.  ED Course  Procedures (including critical care time) DIAGNOSTIC STUDIES: Oxygen Saturation is 98% on RA, normal by my interpretation.    COORDINATION OF CARE: 3:48 PM-Discussed treatment plan which includes CXR and inhaler with pt at bedside and pt agreed to plan.   Labs Review Labs Reviewed - No data to display  Imaging Review Dg Chest 2 View  11/29/2014   CLINICAL DATA:  Productive cough and sore throat for 2 days. History of bronchitis. Smoker.  EXAM: CHEST  2 VIEW  COMPARISON:  05/28/2014  FINDINGS: Mild hyperinflation. Probable cholecystectomy clips. Midline trachea. Normal heart size and mediastinal contours. No pleural effusion or pneumothorax. Clear lungs. Diffuse peribronchial thickening.  IMPRESSION: 1.  No acute cardiopulmonary disease. 2. Mild peribronchial thickening which may relate to chronic bronchitis or smoking.   Electronically Signed   By: Jeronimo GreavesKyle  Talbot M.D.   On: 11/29/2014 15:33    EKG Interpretation None      MDM   Final  diagnoses:  Acute bronchitis due to infection    Albuterol zithromax Prednisone taper Pt counseled on need to stop smoking    [name], PA-C 11/29/14 1554  Arby Barrette, MD 11/29/14 [phone]

## 2014-11-29 NOTE — ED Notes (Signed)
Pt sts here with cough and possible bronchitis since yesterday.

## 2014-12-02 ENCOUNTER — Ambulatory Visit: Payer: 59 | Attending: Internal Medicine

## 2014-12-02 ENCOUNTER — Telehealth: Payer: Self-pay | Admitting: *Deleted

## 2014-12-02 NOTE — Telephone Encounter (Signed)
Patient treated recently in ED for bronchitis.  He states he wants a physical.  Transferred patient to scheduling.

## 2014-12-10 ENCOUNTER — Ambulatory Visit: Payer: 59 | Attending: Internal Medicine | Admitting: Internal Medicine

## 2014-12-10 ENCOUNTER — Encounter: Payer: Self-pay | Admitting: Internal Medicine

## 2014-12-10 VITALS — BP 130/85 | HR 69 | Temp 98.1°F | Resp 16 | Ht 67.0 in | Wt 179.0 lb

## 2014-12-10 DIAGNOSIS — H6123 Impacted cerumen, bilateral: Secondary | ICD-10-CM | POA: Diagnosis not present

## 2014-12-10 DIAGNOSIS — R0981 Nasal congestion: Secondary | ICD-10-CM

## 2014-12-10 DIAGNOSIS — Z1211 Encounter for screening for malignant neoplasm of colon: Secondary | ICD-10-CM

## 2014-12-10 DIAGNOSIS — F17200 Nicotine dependence, unspecified, uncomplicated: Secondary | ICD-10-CM

## 2014-12-10 DIAGNOSIS — Z8709 Personal history of other diseases of the respiratory system: Secondary | ICD-10-CM | POA: Diagnosis not present

## 2014-12-10 DIAGNOSIS — Z0000 Encounter for general adult medical examination without abnormal findings: Secondary | ICD-10-CM | POA: Diagnosis not present

## 2014-12-10 DIAGNOSIS — H6120 Impacted cerumen, unspecified ear: Secondary | ICD-10-CM | POA: Insufficient documentation

## 2014-12-10 DIAGNOSIS — R062 Wheezing: Secondary | ICD-10-CM | POA: Diagnosis not present

## 2014-12-10 DIAGNOSIS — IMO0001 Reserved for inherently not codable concepts without codable children: Secondary | ICD-10-CM

## 2014-12-10 DIAGNOSIS — Z720 Tobacco use: Secondary | ICD-10-CM | POA: Diagnosis not present

## 2014-12-10 LAB — CBC WITH DIFFERENTIAL/PLATELET
BASOS PCT: 0 % (ref 0–1)
Basophils Absolute: 0 10*3/uL (ref 0.0–0.1)
EOS ABS: 0.2 10*3/uL (ref 0.0–0.7)
EOS PCT: 2 % (ref 0–5)
HEMATOCRIT: 47.6 % (ref 39.0–52.0)
HEMOGLOBIN: 16.7 g/dL (ref 13.0–17.0)
LYMPHS ABS: 2.8 10*3/uL (ref 0.7–4.0)
Lymphocytes Relative: 29 % (ref 12–46)
MCH: 32.3 pg (ref 26.0–34.0)
MCHC: 35.1 g/dL (ref 30.0–36.0)
MCV: 92.1 fL (ref 78.0–100.0)
MONO ABS: 0.7 10*3/uL (ref 0.1–1.0)
MONOS PCT: 7 % (ref 3–12)
MPV: 11.6 fL (ref 8.6–12.4)
NEUTROS PCT: 62 % (ref 43–77)
Neutro Abs: 6.1 10*3/uL (ref 1.7–7.7)
Platelets: 146 10*3/uL — ABNORMAL LOW (ref 150–400)
RBC: 5.17 MIL/uL (ref 4.22–5.81)
RDW: 13.5 % (ref 11.5–15.5)
WBC: 9.8 10*3/uL (ref 4.0–10.5)

## 2014-12-10 LAB — COMPLETE METABOLIC PANEL WITH GFR
ALBUMIN: 4.3 g/dL (ref 3.5–5.2)
ALT: 58 U/L — AB (ref 0–53)
AST: 37 U/L (ref 0–37)
Alkaline Phosphatase: 114 U/L (ref 39–117)
BUN: 10 mg/dL (ref 6–23)
CALCIUM: 9.6 mg/dL (ref 8.4–10.5)
CHLORIDE: 103 meq/L (ref 96–112)
CO2: 27 mEq/L (ref 19–32)
CREATININE: 0.69 mg/dL (ref 0.50–1.35)
GFR, Est Non African American: >89 mL/min
Glucose, Bld: 87 mg/dL (ref 70–99)
POTASSIUM: 5 meq/L (ref 3.5–5.3)
SODIUM: 139 meq/L (ref 135–145)
TOTAL PROTEIN: 7.6 g/dL (ref 6.0–8.3)
Total Bilirubin: 1.2 mg/dL (ref 0.2–1.2)

## 2014-12-10 LAB — TSH: TSH: 1.592 u[IU]/mL (ref 0.350–4.500)

## 2014-12-10 MED ORDER — FLUTICASONE PROPIONATE 50 MCG/ACT NA SUSP: 2.0000 | Freq: Every day | NASAL | Status: DC

## 2014-12-10 MED ORDER — CARBAMIDE PEROXIDE 6.5 % OT SOLN: 5.0000 [drp] | Freq: Two times a day (BID) | OTIC | Status: DC

## 2014-12-10 NOTE — Progress Notes (Signed)
Patient Demographics  Mathew Bridges, is a 54 y.o. male  ZOX:[id number]  [id number]  DOB - 31-Dec-1960  CC:  Chief Complaint  Patient presents with  . Establish Care       HPI: Mathew Bridges is a 54 y.o. male here today to establish medical care and is requesting annual physical examination.patient recently went to the emergency room with symptoms of bronchitis, EMR reviewed  patient was prescribed her cough medication Zithromax and albuterol when necessary as per patient he does not have any fever chills has occasional cough which is more worse at night and also feels mucous draining down the back of throat, patient is trying to cut down on smoking. Patient has No headache, No chest pain, No abdominal pain - No Nausea, No new weakness tingling or numbness, No Cough - SOB.  No Known Allergies History reviewed. No pertinent past medical history. Current Outpatient Prescriptions on File Prior to Visit  Medication Sig Dispense Refill  . albuterol (PROVENTIL HFA;VENTOLIN HFA) 108 (90 BASE) MCG/ACT inhaler Inhale 1-2 puffs into the lungs every 6 (six) hours as needed for wheezing or shortness of breath. 1 Inhaler 0  . azithromycin (ZITHROMAX) 250 MG tablet Take 1 tablet (250 mg total) by mouth daily. Take first 2 tablets together, then 1 every day until finished. 6 tablet 0  . predniSONE (DELTASONE) 10 MG tablet 6,5,4,3,2,1 taper 21 tablet 0  . guaiFENesin (ROBITUSSIN) 100 MG/5ML liquid Take 5-10 mLs (100-200 mg total) by mouth every 4 (four) hours as needed for cough. (Patient not taking: Reported on 12/10/2014) 60 mL 0  . HYDROcodone-homatropine (HYCODAN) 5-1.5 MG/5ML syrup Take 5 mLs by mouth every 6 (six) hours as needed for cough. (Patient not taking: Reported on 12/10/2014) 120 mL 0   No current facility-administered medications on file prior to visit.   Family History  Problem Relation Age of Onset  . COPD Paternal Grandfather    History   Social History  . Marital Status:  Married    Spouse Name: N/A  . Number of Children: N/A  . Years of Education: N/A   Occupational History  . Not on file.   Social History Main Topics  . Smoking status: Current Every Day Smoker -- 0.50 packs/day for 30 years    Types: Cigarettes  . Smokeless tobacco: Not on file  . Alcohol Use: No  . Drug Use: No  . Sexual Activity: Not Currently   Other Topics Concern  . Not on file   Social History Narrative    Review of Systems: Constitutional: Negative for fever, chills, diaphoresis, activity change, appetite change and fatigue. HENT: Negative for ear pain, nosebleeds, congestion, facial swelling, rhinorrhea, neck pain, neck stiffness and ear discharge.  Eyes: Negative for pain, discharge, redness, itching and visual disturbance. Respiratory: Negative for cough, choking, chest tightness, shortness of breath, wheezing and stridor.  Cardiovascular: Negative for chest pain, palpitations and leg swelling. Gastrointestinal: Negative for abdominal distention. Genitourinary: Negative for dysuria, urgency, frequency, hematuria, flank pain, decreased urine volume, difficulty urinating and dyspareunia.  Musculoskeletal: Negative for back pain, joint swelling, arthralgia and gait problem. Neurological: Negative for dizziness, tremors, seizures, syncope, facial asymmetry, speech difficulty, weakness, light-headedness, numbness and headaches.  Hematological: Negative for adenopathy. Does not bruise/bleed easily. Psychiatric/Behavioral: Negative for hallucinations, behavioral problems, confusion, dysphoric mood, decreased concentration and agitation.    Objective:   Filed Vitals:   12/10/14 1529  BP: 130/85  Pulse: 69  Temp: 98.1 F (36.7 C)  Resp: 16  Physical Exam: Constitutional: Patient appears well-developed and well-nourished. No distress. HENT: Normocephalic, atraumatic, External right and left ear normal. Oropharynx is clear and moist. Increased wax in both  ears. Eyes: Conjunctivae and EOM are normal. PERRLA, no scleral icterus. Neck: Normal ROM. Neck supple. No JVD. No tracheal deviation. No thyromegaly. CVS: RRR, S1/S2 +, no murmurs, no gallops, no carotid bruit.  Pulmonary: Effort and breath sounds normal, no stridor, rhonchi, wheezes, rales.  Abdominal: Soft. BS +, no distension, tenderness, rebound or guarding.  Musculoskeletal: Normal range of motion. No edema and no tenderness.  Neuro: Alert. Normal reflexes, muscle tone coordination. No cranial nerve deficit. Skin: Skin is warm and dry. No rash noted. Not diaphoretic. No erythema. No pallor. Psychiatric: Normal mood and affect. Behavior, judgment, thought content normal.  Lab Results  Component Value Date   WBC 8.4 05/28/2011   HGB 17.5* 05/28/2011   HCT 47.7 05/28/2011   MCV 90.0 05/28/2011   PLT 152 05/28/2011   Lab Results  Component Value Date   CREATININE 0.80 05/28/2011   BUN 12 05/28/2011   NA 138 05/28/2011   K 4.2 05/28/2011   CL 104 05/28/2011    No results found for: HGBA1C Lipid Panel  No results found for: CHOL, TRIG, HDL, CHOLHDL, VLDL, LDLCALC     Assessment and plan:   1. Annual physical exam Ordered baseline blood work. - CBC with Differential/Platelet - COMPLETE METABOLIC PANEL WITH GFR - TSH - Vit D  25 hydroxy (rtn osteoporosis monitoring) - Hemoglobin A1c  2. History of bronchitis Patient is completed the course of antibiotic.  3. Excess ear wax, bilateral  - carbamide peroxide (DEBROX) 6.5 % otic solution; Place 5 drops into both ears 2 (two) times daily.  Dispense: 15 mL; Refill: 1  4. Nasal congestion  - fluticasone (FLONASE) 50 MCG/ACT nasal spray; Place 2 sprays into both nostrils daily.  Dispense: 16 g; Refill: 2  5. Wheezing Albuterol when necessary.  6. Special screening for malignant neoplasms, colon  - Ambulatory referral to Gastroenterology  7. Smoking Patient is trying to quit smoking.        Health  Maintenance -Colonoscopy:referred to GI : -Vaccinations:  Patient declined flu shot, he was think about Pneumovax.   Return in about 3 months (around 03/12/2015), or if symptoms worsen or fail to improve.    The patient was given clear instructions to go to ER or return to medical center if symptoms don't improve, worsen or new problems develop. The patient verbalized understanding. The patient was told to call to get lab results if they haven't heard anything in the next week.    This note has been created with Education officer, environmentalDragon speech recognition software and smart phrase technology. Any transcriptional errors are unintentional.   Doris CheadleADVANI, Talton Delpriore, MD

## 2014-12-10 NOTE — Progress Notes (Signed)
Pt is here to establish care. Pt recently got a cold and his symptoms are keeping him up at night. Pt is requesting a physical.

## 2014-12-11 ENCOUNTER — Telehealth: Payer: Self-pay

## 2014-12-11 LAB — HEMOGLOBIN A1C
Hgb A1c MFr Bld: 5.8 % — ABNORMAL HIGH (ref <5.7)
MEAN PLASMA GLUCOSE: 120 mg/dL — AB (ref <117)

## 2014-12-11 LAB — VITAMIN D 25 HYDROXY (VIT D DEFICIENCY, FRACTURES): Vit D, 25-Hydroxy: 17 ng/mL — ABNORMAL LOW (ref 30–100)

## 2014-12-11 MED ORDER — VITAMIN D (ERGOCALCIFEROL) 1.25 MG (50000 UNIT) PO CAPS: 50000.0000 [IU] | ORAL_CAPSULE | ORAL | Status: DC

## 2014-12-11 NOTE — Telephone Encounter (Signed)
Patient is aware of his lab results and to pick up his prescription for vitamin D

## 2014-12-11 NOTE — Telephone Encounter (Signed)
-----   Message from Doris Cheadleeepak Advani, MD sent at 12/11/2014  9:59 AM EDT ----- Blood work reviewed, noticed low vitamin D, call patient advise to start ergocalciferol 50,000 units once a week for the duration of  12 weeks.  noticed hemoglobin A1c of 5.8 %, patient has prediabetes, call and advise patient for low carbohydrate diet.

## 2015-10-09 ENCOUNTER — Emergency Department (HOSPITAL_COMMUNITY)
Admission: EM | Admit: 2015-10-09 | Discharge: 2015-10-09 | Disposition: A | Discharge status: Home / Self Care | Payer: BLUE CROSS/BLUE SHIELD | Attending: Emergency Medicine | Admitting: Emergency Medicine

## 2015-10-09 ENCOUNTER — Emergency Department (HOSPITAL_COMMUNITY): Payer: BLUE CROSS/BLUE SHIELD

## 2015-10-09 ENCOUNTER — Encounter (HOSPITAL_COMMUNITY): Payer: Self-pay | Admitting: Emergency Medicine

## 2015-10-09 DIAGNOSIS — R61 Generalized hyperhidrosis: Secondary | ICD-10-CM | POA: Diagnosis not present

## 2015-10-09 DIAGNOSIS — F17210 Nicotine dependence, cigarettes, uncomplicated: Secondary | ICD-10-CM | POA: Diagnosis not present

## 2015-10-09 DIAGNOSIS — K420 Umbilical hernia with obstruction, without gangrene: Secondary | ICD-10-CM | POA: Diagnosis not present

## 2015-10-09 DIAGNOSIS — Z7951 Long term (current) use of inhaled steroids: Secondary | ICD-10-CM | POA: Diagnosis not present

## 2015-10-09 DIAGNOSIS — R001 Bradycardia, unspecified: Secondary | ICD-10-CM | POA: Diagnosis not present

## 2015-10-09 DIAGNOSIS — Z79899 Other long term (current) drug therapy: Secondary | ICD-10-CM | POA: Insufficient documentation

## 2015-10-09 DIAGNOSIS — R1033 Periumbilical pain: Secondary | ICD-10-CM | POA: Diagnosis present

## 2015-10-09 MED ORDER — MORPHINE SULFATE (PF) 4 MG/ML IV SOLN
4.0000 mg | Freq: Once | INTRAVENOUS | Status: AC
  Administered 2015-10-09: 4 mg via INTRAVENOUS
  Filled 2015-10-09: qty 1

## 2015-10-09 MED ORDER — ONDANSETRON HCL 4 MG/2ML IJ SOLN
4.0000 mg | Freq: Once | INTRAMUSCULAR | Status: AC
  Administered 2015-10-09: 4 mg via INTRAVENOUS
  Filled 2015-10-09: qty 2

## 2015-10-09 NOTE — ED Notes (Signed)
Patient transported to X-ray 

## 2015-10-09 NOTE — Discharge Instructions (Signed)
Hernia, Adult A hernia is the bulging of an organ or tissue through a weak spot in the muscles of the abdomen (abdominal wall). Hernias develop most often near the navel or groin. There are many kinds of hernias. Common kinds include:  Femoral hernia. This kind of hernia develops under the groin in the upper thigh area.  Inguinal hernia. This kind of hernia develops in the groin or scrotum.  Umbilical hernia. This kind of hernia develops near the navel.  Hiatal hernia. This kind of hernia causes part of the stomach to be pushed up into the chest.  Incisional hernia. This kind of hernia bulges through a scar from an abdominal surgery. CAUSES This condition may be caused by:  Heavy lifting.  Coughing over a long period of time.  Straining to have a bowel movement.  An incision made during an abdominal surgery.  A birth defect (congenital defect).  Excess weight or obesity.  Smoking.  Poor nutrition.  Cystic fibrosis.  Excess fluid in the abdomen.  Undescended testicles. SYMPTOMS Symptoms of a hernia include:  A lump on the abdomen. This is the first sign of a hernia. The lump may become more obvious with standing, straining, or coughing. It may get bigger over time if it is not treated or if the condition causing it is not treated.  Pain. A hernia is usually painless, but it may become painful over time if treatment is delayed. The pain is usually dull and may get worse with standing or lifting heavy objects. Sometimes a hernia gets tightly squeezed in the weak spot (strangulated) or stuck there (incarcerated) and causes additional symptoms. These symptoms may include:  Vomiting.  Nausea.  Constipation.  Irritability. DIAGNOSIS A hernia may be diagnosed with:  A physical exam. During the exam your health care provider may ask you to cough or to make a specific movement, because a hernia is usually more visible when you move.  Imaging tests. These can  include:  X-rays.  Ultrasound.  CT scan. TREATMENT A hernia that is small and painless may not need to be treated. A hernia that is large or painful may be treated with surgery. Inguinal hernias may be treated with surgery to prevent incarceration or strangulation. Strangulated hernias are always treated with surgery, because lack of blood to the trapped organ or tissue can cause it to die. Surgery to treat a hernia involves pushing the bulge back into place and repairing the weak part of the abdomen. HOME CARE INSTRUCTIONS  Avoid straining.  Do not lift anything heavier than 10 lb (4.5 kg).  Lift with your leg muscles, not your back muscles. This helps avoid strain.  When coughing, try to cough gently.  Prevent constipation. Constipation leads to straining with bowel movements, which can make a hernia worse or cause a hernia repair to break down. You can prevent constipation by:  Eating a high-fiber diet that includes plenty of fruits and vegetables.  Drinking enough fluids to keep your urine clear or pale yellow. Aim to drink 6-8 glasses of water per day.  Using a stool softener as directed by your health care provider.  Lose weight, if you are overweight.  Do not use any tobacco products, including cigarettes, chewing tobacco, or electronic cigarettes. If you need help quitting, ask your health care provider.  Keep all follow-up visits as directed by your health care provider. This is important. Your health care provider may need to monitor your condition. SEEK MEDICAL CARE IF:  You have   swelling, redness, and pain in the affected area.  Your bowel habits change. SEEK IMMEDIATE MEDICAL CARE IF:  You have a fever.  You have abdominal pain that is getting worse.  You feel nauseous or you vomit.  You cannot push the hernia back in place by gently pressing on it while you are lying down.  The hernia:  Changes in shape or size.  Is stuck outside the  abdomen.  Becomes discolored.  Feels hard or tender.   This information is not intended to replace advice given to you by your health care provider. Make sure you discuss any questions you have with your health care provider.   Document Released: 09/13/2005 Document Revised: 10/04/2014 Document Reviewed: 07/24/2014 Elsevier Interactive Patient Education 2016 [employer]  

## 2015-10-09 NOTE — ED Notes (Signed)
Pt reports sudden onset of abd pain while at work at 1000 am. Pt alert x4.

## 2015-10-09 NOTE — ED Provider Notes (Signed)
CSN: [id number]     Arrival date & time 10/09/15  1104 History   First MD Initiated Contact with Patient 10/09/15 1142     Chief Complaint  Patient presents with  . Abdominal Pain     (Consider location/radiation/quality/duration/timing/severity/associated sxs/prior Treatment) Patient is a 55 y.o. male presenting with abdominal pain. The history is provided by the patient.  Abdominal Pain Pain location:  Periumbilical Pain quality: sharp, shooting and stabbing   Pain radiates to:  Does not radiate Pain severity:  Severe Onset quality:  Sudden Duration:  2 hours Timing:  Constant Progression:  Unchanged Chronicity:  New Context comment:  Patient was at work lifting an ice bucket when he suddenly developed severe pain in his abdomen Relieved by:  Nothing Worsened by:  Nothing tried Ineffective treatments:  None tried Associated symptoms: nausea   Associated symptoms: no cough, no diarrhea, no dysuria, no fever, no shortness of breath and no vomiting   Risk factors: no alcohol abuse, has not had multiple surgeries and no NSAID use     History reviewed. No pertinent past medical history. Past Surgical History  Procedure Laterality Date  . Cholecystectomy     Family History  Problem Relation Age of Onset  . COPD Paternal Grandfather    Social History  Substance Use Topics  . Smoking status: Current Every Day Smoker -- 0.50 packs/day for 30 years    Types: Cigarettes  . Smokeless tobacco: None  . Alcohol Use: No    Review of Systems  Constitutional: Negative for fever.  Respiratory: Negative for cough and shortness of breath.   Gastrointestinal: Positive for nausea and abdominal pain. Negative for vomiting and diarrhea.  Genitourinary: Negative for dysuria.  All other systems reviewed and are negative.     Allergies  Review of patient's allergies indicates no known allergies.  Home Medications   Prior to Admission medications   Medication Sig Start Date End  Date Taking? Authorizing Provider  albuterol (PROVENTIL HFA;VENTOLIN HFA) 108 (90 BASE) MCG/ACT inhaler Inhale 1-2 puffs into the lungs every 6 (six) hours as needed for wheezing or shortness of breath. 11/29/14   [name], PA-C  azithromycin (ZITHROMAX) 250 MG tablet Take 1 tablet (250 mg total) by mouth daily. Take first 2 tablets together, then 1 every day until finished. 11/29/14   [name], PA-C  carbamide peroxide (DEBROX) 6.5 % otic solution Place 5 drops into both ears 2 (two) times daily. 12/10/14   Doris Cheadle, MD  fluticasone (FLONASE) 50 MCG/ACT nasal spray Place 2 sprays into both nostrils daily. 12/10/14   Doris Cheadle, MD  guaiFENesin (ROBITUSSIN) 100 MG/5ML liquid Take 5-10 mLs (100-200 mg total) by mouth every 4 (four) hours as needed for cough. Patient not taking: Reported on 12/10/2014 05/28/14   Fayrene Helper, PA-C  HYDROcodone-homatropine [facility]) 5-1.5 MG/5ML syrup Take 5 mLs by mouth every 6 (six) hours as needed for cough. Patient not taking: Reported on 12/10/2014 05/28/14   Fayrene Helper, PA-C  predniSONE (DELTASONE) 10 MG tablet 6,5,4,3,2,1 taper 11/29/14   [name], PA-C  Vitamin D, Ergocalciferol, (DRISDOL) 50000 UNITS CAPS capsule Take 1 capsule (50,000 Units total) by mouth every 7 (seven) days. 12/11/14   Deepak Advani, MD   BP 163/90 mmHg  Pulse 45  Temp(Src) 94 F (34.4 C) (Oral)  Resp 18  SpO2 100% Physical Exam  Constitutional: He is oriented to person, place, and time. He appears well-developed and well-nourished. He appears distressed.  HENT:  Head:  Normocephalic and atraumatic.  Mouth/Throat: Oropharynx is clear and moist.  Eyes: Conjunctivae and EOM are normal. Pupils are equal, round, and reactive to light.  Neck: Normal range of motion. Neck supple.  Cardiovascular: Regular rhythm and intact distal pulses.  Bradycardia present.   No murmur heard. Pulmonary/Chest: Effort normal and breath sounds normal. No respiratory distress. He has no wheezes.  He has no rales.  Abdominal: Soft. He exhibits no distension. There is tenderness in the periumbilical area. There is no rebound and no guarding. A hernia is present.    Musculoskeletal: Normal range of motion. He exhibits no edema or tenderness.  Neurological: He is alert and oriented to person, place, and time.  Skin: Skin is warm. No rash noted. He is diaphoretic. No erythema.  Psychiatric: He has a normal mood and affect. His behavior is normal.  Nursing note and vitals reviewed.   ED Course  Hernia reduction Date/Time: 10/09/2015 12:12 PM Performed by: Gwyneth SproutPLUNKETT, Maryna Yeagle Authorized by: Gwyneth SproutPLUNKETT, Helaina Stefano Consent: Verbal consent obtained. Risks and benefits: risks, benefits and alternatives were discussed Consent given by: patient Patient understanding: patient states understanding of the procedure being performed Required items: required blood products, implants, devices, and special equipment available Patient identity confirmed: verbally with patient Time out: Immediately prior to procedure a "time out" was called to verify the correct patient, procedure, equipment, support staff and site/side marked as required. Local anesthesia used: no Patient sedated: no Patient tolerance: Patient tolerated the procedure well with no immediate complications Comments: Incarcerated umbilical hernia present. With distal pressure and slow gentle anterior pressure after 3-4 minutes hernia spontaneously reduced   (including critical care time) Labs Review Labs Reviewed - No data to display  Imaging Review Dg Abd 1 View  10/09/2015  CLINICAL DATA:  Incarcerated umbilical hernia. Status post reduction. EXAM: ABDOMEN - 1 VIEW COMPARISON:  None. FINDINGS: The bowel gas pattern is normal. No radio-opaque calculi or other significant radiographic abnormality are seen. IMPRESSION: Negative. Electronically Signed   By: Elige KoHetal  Patel   On: 10/09/2015 13:52   I have personally reviewed and evaluated these  images and lab results as part of my medical decision-making.   EKG Interpretation None      MDM   Final diagnoses:  Incarcerated umbilical hernia    Patient is a 55 year old male presenting today with the abrupt onset of abdominal pain started when he was lifting something at work. The pain has continued for the last 2 hours causing diaphoresis and shortness of breath.  Patient denies any long-term medical problems and takes no medications regularly. He does smoke daily but denies alcohol use.  On exam patient has an incarcerated umbilical hernia that is hard and tender. With gentle pressure applied and the patient relaxing and taking deep breaths was able to reduce the incarcerated umbilical hernia with significant improvement of pain. Patient has been bradycardic upon arrival here some feel is most likely related to a vagal response from his pain however he may chronically have bradycardia. It appears to be sinus and he denies any chest pain or shortness of breath.  Patient is now feeling much better. Instructed him to avoid heavy lifting also he will need to follow-up with general surgery for hernia repair.    Gwyneth SproutWhitney Lennox Leikam, MD 10/09/15 1424

## 2016-01-21 ENCOUNTER — Ambulatory Visit: Payer: Self-pay | Admitting: General Surgery

## 2016-01-21 NOTE — H&P (Signed)
History of Present Illness Mathew Bridges(Mathew Kann MD; 01/21/2016 11:28 AM) Patient words: 3 month hernia check.  The patient is a 55 year old male who presents with an incisional hernia. Patient comes in today 3 months after his initial visit for a ventral likely incisional hernia. Patient initially was smoking 10 cigarettes a day. He states is down to approximately 3 cigarettes. I discussed the patient he should continue to decrease his smoking and quit entirely.  The patient states that he's been to the ER secondary to the incarcerated hernia and pain. This was reduced and his pain resolved.  The patient works with heavy lifting at work, greater than 50 pounds. ___________________________________  patient is a 55 year old male who is referred by [facility] for evaluation of an umbilical hernia. Patient states that this tear for at least 5 years. Patient states that at laparoscopic cholecystectomy multiple years ago. Patient presented to the ER secondary to pain. This was reducible there.  Patient has no other medical issues. Patient currently smokes 10 cigarettes a day.   Other Problems Mathew Bridges(Michelle R Brooks, CMA; 01/21/2016 11:19 AM) VENTRAL HERNIA WITHOUT OBSTRUCTION OR GANGRENE (K43.9)10/27/2015  Past Surgical History Mathew Bridges[place]; 01/21/2016 11:19 AM) No pertinent past surgical history  Diagnostic Studies History Mathew Bridges(Michelle R [place]; 01/21/2016 11:19 AM) Colonoscopy never  Allergies Mathew Bridges(Michelle R Brooks, CMA; 01/21/2016 11:19 AM) No Known Drug Allergies01/30/2017  Medication History Mathew Bridges(Michelle R Brooks, CMA; 01/21/2016 11:19 AM) Multiple Vitamin (Oral) Active. Medications Reconciled  Social History Mathew Bridges([place]; 01/21/2016 11:19 AM) Caffeine use Coffee. Tobacco use Current every day smoker.  Family History Mathew Bridges(Michelle R [place]; 01/21/2016 11:19 AM) No pertinent family history First Degree Relatives    Review of Systems Mathew Bridges(Trestan Vahle,  MD; 01/21/2016 11:36 AM) General Not Present- Appetite Loss, Chills, Fatigue, Fever, Night Sweats, Weight Gain and Weight Loss. Skin Present- Change in Wart/Mole. Not Present- Dryness, Hives, Jaundice, New Lesions, Non-Healing Wounds, Rash and Ulcer. HEENT Not Present- Earache, Hearing Loss, Hoarseness, Nose Bleed, Oral Ulcers, Ringing in the Ears, Seasonal Allergies, Sinus Pain, Sore Throat, Visual Disturbances, Wears glasses/contact lenses and Yellow Eyes. Respiratory Not Present- Bloody sputum, Chronic Cough, Difficulty Breathing, Snoring and Wheezing. Breast Not Present- Breast Mass, Breast Pain, Nipple Discharge and Skin Changes. Cardiovascular Not Present- Chest Pain, Difficulty Breathing Lying Down, Leg Cramps, Palpitations, Rapid Heart Rate, Shortness of Breath and Swelling of Extremities. Gastrointestinal Not Present- Abdominal Pain, Bloating, Bloody Stool, Change in Bowel Habits, Chronic diarrhea, Constipation, Difficulty Swallowing, Excessive gas, Gets full quickly at meals, Hemorrhoids, Indigestion, Nausea, Rectal Pain and Vomiting. Male Genitourinary Not Present- Blood in Urine, Change in Urinary Stream, Frequency, Impotence, Nocturia, Painful Urination, Urgency and Urine Leakage. Musculoskeletal Not Present- Back Pain, Joint Pain, Joint Stiffness, Muscle Pain, Muscle Weakness and Swelling of Extremities. Neurological Not Present- Decreased Memory, Fainting, Headaches, Numbness, Seizures, Tingling, Tremor, Trouble walking and Weakness. Psychiatric Not Present- Anxiety, Bipolar, Change in Sleep Pattern, Depression, Fearful and Frequent crying. Endocrine Not Present- Cold Intolerance, Excessive Hunger, Hair Changes, Heat Intolerance and New Diabetes. Hematology Not Present- Easy Bruising, Excessive bleeding, Gland problems, HIV and Persistent Infections.  Vitals [employer](Michelle R. Brooks CMA; 01/21/2016 11:19 AM) 01/21/2016 11:19 AM Weight: 178.5 lb Height: 66in Body Surface Area: 1.91 m  Body Mass Index: 28.81 kg/m  BP: 130/86 (Sitting, Left Arm, Standard)       Physical Exam Mathew Bridges(Shamiyah Ngu, MD; 01/21/2016 11:36 AM) General Mental Status-Alert. General Appearance-Consistent with stated age. Hydration-Well hydrated. Voice-Normal.  Head and Neck Head-normocephalic,  atraumatic with no lesions or palpable masses. Trachea-midline. Thyroid Gland Characteristics - normal size and consistency.  Chest and Lung Exam Chest and lung exam reveals -quiet, even and easy respiratory effort with no use of accessory muscles and on auscultation, normal breath sounds, no adventitious sounds and normal vocal resonance. Inspection Chest Wall - Normal. Back - normal.  Cardiovascular Cardiovascular examination reveals -normal heart sounds, regular rate and rhythm with no murmurs and normal pedal pulses bilaterally.  Abdomen Inspection Skin - Scar - no surgical scars. Hernias - Ventral hernia - Reducible(Just superior to the umbilicus, approximately 2 cm in size.). Palpation/Percussion Normal exam - Soft, Non Tender, No Rebound tenderness, No Rigidity (guarding) and No hepatosplenomegaly. Auscultation Normal exam - Bowel sounds normal.    Assessment & Plan Mathew Filler MD; 01/21/2016 11:36 AM) VENTRAL HERNIA WITHOUT OBSTRUCTION OR GANGRENE (K43.9) Impression: 55 year old male with a ventral hernia 1. The patient will like to proceed to the operating room for laparoscopic umbilical hernia repair with mesh.  2. I discussed with the patient the signs and symptoms of incarceration and strangulation and the need to proceed to the ER should they occur.  3. I discussed with the patient the risks and benefits of the procedure to include but not limited to: Infection, bleeding, damage to surrounding structures, possible need for further surgery, possible nerve pain, and possible recurrence. The patient was understanding and wishes to proceed.  4. Did discuss  with the patient to decrease his smoking and quit entirely. He will continue to work on this.

## 2016-03-15 ENCOUNTER — Encounter (HOSPITAL_COMMUNITY)
Admission: RE | Admit: 2016-03-15 | Discharge: 2016-03-15 | Disposition: A | Discharge status: Home / Self Care | Payer: BLUE CROSS/BLUE SHIELD | Source: Ambulatory Visit | Attending: General Surgery | Admitting: General Surgery

## 2016-03-15 ENCOUNTER — Encounter (HOSPITAL_COMMUNITY): Payer: Self-pay

## 2016-03-15 ENCOUNTER — Inpatient Hospital Stay (HOSPITAL_COMMUNITY)
Admission: RE | Admit: 2016-03-15 | Discharge: 2016-03-15 | Disposition: A | Discharge status: Home / Self Care | Payer: BLUE CROSS/BLUE SHIELD | Source: Ambulatory Visit

## 2016-03-15 DIAGNOSIS — K439 Ventral hernia without obstruction or gangrene: Secondary | ICD-10-CM | POA: Insufficient documentation

## 2016-03-15 DIAGNOSIS — Z01812 Encounter for preprocedural laboratory examination: Secondary | ICD-10-CM | POA: Diagnosis not present

## 2016-03-15 HISTORY — DX: Unspecified asthma, uncomplicated: J45.909

## 2016-03-15 HISTORY — DX: Chronic obstructive pulmonary disease, unspecified: J44.9

## 2016-03-15 HISTORY — DX: Reserved for inherently not codable concepts without codable children: IMO0001

## 2016-03-15 LAB — CBC
HEMATOCRIT: 45.9 % (ref 39.0–52.0)
Hemoglobin: 15.9 g/dL (ref 13.0–17.0)
MCH: 32.3 pg (ref 26.0–34.0)
MCHC: 34.6 g/dL (ref 30.0–36.0)
MCV: 93.1 fL (ref 78.0–100.0)
Platelets: 115 10*3/uL — ABNORMAL LOW (ref 150–400)
RBC: 4.93 MIL/uL (ref 4.22–5.81)
RDW: 12.9 % (ref 11.5–15.5)
WBC: 7.2 10*3/uL (ref 4.0–10.5)

## 2016-03-21 NOTE — Anesthesia Preprocedure Evaluation (Addendum)
Anesthesia Evaluation  Patient identified by MRN, date of birth, ID band Patient awake    Reviewed: Allergy & Precautions, NPO status , Patient's Chart, lab work & pertinent test results  History of Anesthesia Complications Negative for: history of anesthetic complications  Airway Mallampati: II  TM Distance: >3 FB Neck ROM: Full    Dental  (+) Edentulous Upper, Edentulous Lower, Dental Advisory Given   Pulmonary shortness of breath, asthma , COPD,  COPD inhaler, Current Smoker,    breath sounds clear to auscultation       Cardiovascular negative cardio ROS   Rhythm:Regular Rate:Normal     Neuro/Psych    GI/Hepatic negative GI ROS, Neg liver ROS,   Endo/Other  negative endocrine ROS  Renal/GU negative Renal ROS     Musculoskeletal   Abdominal   Peds  Hematology negative hematology ROS (+)   Anesthesia Other Findings   Reproductive/Obstetrics                           Anesthesia Physical Anesthesia Plan  ASA: II  Anesthesia Plan: General   Post-op Pain Management:    Induction: Intravenous  Airway Management Planned: Oral ETT  Additional Equipment:   Intra-op Plan:   Post-operative Plan:   Informed Consent: I have reviewed the patients History and Physical, chart, labs and discussed the procedure including the risks, benefits and alternatives for the proposed anesthesia with the patient or authorized representative who has indicated his/her understanding and acceptance.   Dental advisory given  Plan Discussed with: CRNA, Anesthesiologist and Surgeon  Anesthesia Plan Comments:        Anesthesia Quick Evaluation

## 2016-03-22 ENCOUNTER — Encounter (HOSPITAL_COMMUNITY)
Admission: RE | Disposition: A | Discharge status: Home / Self Care | Payer: Self-pay | Source: Ambulatory Visit | Attending: General Surgery

## 2016-03-22 ENCOUNTER — Encounter (HOSPITAL_COMMUNITY): Payer: Self-pay

## 2016-03-22 ENCOUNTER — Ambulatory Visit (HOSPITAL_COMMUNITY): Payer: BLUE CROSS/BLUE SHIELD | Admitting: Anesthesiology

## 2016-03-22 ENCOUNTER — Ambulatory Visit (HOSPITAL_COMMUNITY)
Admission: RE | Admit: 2016-03-22 | Discharge: 2016-03-22 | Disposition: A | Discharge status: Home / Self Care | Payer: BLUE CROSS/BLUE SHIELD | Source: Ambulatory Visit | Attending: General Surgery | Admitting: General Surgery

## 2016-03-22 DIAGNOSIS — F17200 Nicotine dependence, unspecified, uncomplicated: Secondary | ICD-10-CM | POA: Insufficient documentation

## 2016-03-22 DIAGNOSIS — J449 Chronic obstructive pulmonary disease, unspecified: Secondary | ICD-10-CM | POA: Insufficient documentation

## 2016-03-22 DIAGNOSIS — K439 Ventral hernia without obstruction or gangrene: Secondary | ICD-10-CM | POA: Diagnosis present

## 2016-03-22 DIAGNOSIS — Z79899 Other long term (current) drug therapy: Secondary | ICD-10-CM | POA: Insufficient documentation

## 2016-03-22 DIAGNOSIS — K429 Umbilical hernia without obstruction or gangrene: Secondary | ICD-10-CM | POA: Insufficient documentation

## 2016-03-22 HISTORY — PX: VENTRAL HERNIA REPAIR: SHX424

## 2016-03-22 HISTORY — PX: INSERTION OF MESH: SHX5868

## 2016-03-22 SURGERY — REPAIR, HERNIA, VENTRAL, LAPAROSCOPIC
Anesthesia: General | Site: Abdomen

## 2016-03-22 MED ORDER — ONDANSETRON HCL 4 MG/2ML IJ SOLN
INTRAMUSCULAR | Status: DC | PRN
  Administered 2016-03-22: 4 mg via INTRAVENOUS

## 2016-03-22 MED ORDER — CHLORHEXIDINE GLUCONATE 4 % EX LIQD: 1.0000 "application " | Freq: Once | CUTANEOUS | Status: DC

## 2016-03-22 MED ORDER — LIDOCAINE HCL (CARDIAC) 20 MG/ML IV SOLN
INTRAVENOUS | Status: DC | PRN
  Administered 2016-03-22: 80 mg via INTRAVENOUS

## 2016-03-22 MED ORDER — SUGAMMADEX SODIUM 200 MG/2ML IV SOLN
INTRAVENOUS | Status: AC
  Filled 2016-03-22: qty 2

## 2016-03-22 MED ORDER — CEFAZOLIN SODIUM-DEXTROSE 2-4 GM/100ML-% IV SOLN
INTRAVENOUS | Status: AC
  Filled 2016-03-22: qty 100

## 2016-03-22 MED ORDER — HYDROMORPHONE HCL 1 MG/ML IJ SOLN
INTRAMUSCULAR | Status: AC
  Administered 2016-03-22: 0.5 mg via INTRAVENOUS
  Filled 2016-03-22: qty 1

## 2016-03-22 MED ORDER — ACETAMINOPHEN 10 MG/ML IV SOLN
INTRAVENOUS | Status: DC | PRN
  Administered 2016-03-22: 1000 mg via INTRAVENOUS

## 2016-03-22 MED ORDER — LACTATED RINGERS IV SOLN
INTRAVENOUS | Status: DC | PRN
  Administered 2016-03-22 (×2): via INTRAVENOUS

## 2016-03-22 MED ORDER — FENTANYL CITRATE (PF) 100 MCG/2ML IJ SOLN
INTRAMUSCULAR | Status: DC | PRN
  Administered 2016-03-22 (×3): 50 ug via INTRAVENOUS

## 2016-03-22 MED ORDER — OXYCODONE-ACETAMINOPHEN 5-325 MG PO TABS: 1.0000 | ORAL_TABLET | ORAL | Status: DC | PRN

## 2016-03-22 MED ORDER — HYDROMORPHONE HCL 1 MG/ML IJ SOLN
0.2500 mg | INTRAMUSCULAR | Status: DC | PRN
  Administered 2016-03-22: 0.5 mg via INTRAVENOUS

## 2016-03-22 MED ORDER — LIDOCAINE 2% (20 MG/ML) 5 ML SYRINGE
INTRAMUSCULAR | Status: AC
  Filled 2016-03-22: qty 5

## 2016-03-22 MED ORDER — PROPOFOL 10 MG/ML IV BOLUS
INTRAVENOUS | Status: DC | PRN
  Administered 2016-03-22: 150 mg via INTRAVENOUS

## 2016-03-22 MED ORDER — BUPIVACAINE HCL (PF) 0.25 % IJ SOLN
INTRAMUSCULAR | Status: AC
  Filled 2016-03-22: qty 30

## 2016-03-22 MED ORDER — MIDAZOLAM HCL 2 MG/2ML IJ SOLN
INTRAMUSCULAR | Status: AC
  Filled 2016-03-22: qty 2

## 2016-03-22 MED ORDER — PROMETHAZINE HCL 25 MG/ML IJ SOLN
6.2500 mg | INTRAMUSCULAR | Status: DC | PRN
Start: 2016-03-22 — End: 2016-03-22

## 2016-03-22 MED ORDER — ROCURONIUM BROMIDE 50 MG/5ML IV SOLN
INTRAVENOUS | Status: AC
  Filled 2016-03-22: qty 1

## 2016-03-22 MED ORDER — BUPIVACAINE HCL 0.25 % IJ SOLN
INTRAMUSCULAR | Status: DC | PRN
  Administered 2016-03-22: 10 mL

## 2016-03-22 MED ORDER — CEFAZOLIN SODIUM-DEXTROSE 2-4 GM/100ML-% IV SOLN
2.0000 g | INTRAVENOUS | Status: AC
  Administered 2016-03-22: 2 g via INTRAVENOUS

## 2016-03-22 MED ORDER — ONDANSETRON HCL 4 MG/2ML IJ SOLN
INTRAMUSCULAR | Status: AC
  Filled 2016-03-22: qty 2

## 2016-03-22 MED ORDER — PROPOFOL 10 MG/ML IV BOLUS
INTRAVENOUS | Status: AC
  Filled 2016-03-22: qty 20

## 2016-03-22 MED ORDER — FENTANYL CITRATE (PF) 250 MCG/5ML IJ SOLN
INTRAMUSCULAR | Status: AC
  Filled 2016-03-22: qty 5

## 2016-03-22 MED ORDER — ACETAMINOPHEN 10 MG/ML IV SOLN
INTRAVENOUS | Status: AC
  Filled 2016-03-22: qty 100

## 2016-03-22 MED ORDER — MIDAZOLAM HCL 5 MG/5ML IJ SOLN
INTRAMUSCULAR | Status: DC | PRN
  Administered 2016-03-22: 2 mg via INTRAVENOUS

## 2016-03-22 MED ORDER — 0.9 % SODIUM CHLORIDE (POUR BTL) OPTIME
TOPICAL | Status: DC | PRN
Start: 2016-03-22 — End: 2016-03-22
  Administered 2016-03-22: 1000 mL

## 2016-03-22 MED ORDER — ROCURONIUM BROMIDE 100 MG/10ML IV SOLN
INTRAVENOUS | Status: DC | PRN
  Administered 2016-03-22: 40 mg via INTRAVENOUS
  Administered 2016-03-22: 10 mg via INTRAVENOUS

## 2016-03-22 MED ORDER — SUGAMMADEX SODIUM 200 MG/2ML IV SOLN
INTRAVENOUS | Status: DC | PRN
  Administered 2016-03-22: 200 mg via INTRAVENOUS

## 2016-03-22 SURGICAL SUPPLY — 51 items
APL SKNCLS STERI-STRIP NONHPOA (GAUZE/BANDAGES/DRESSINGS) ×1
APPLIER CLIP 5 13 M/L LIGAMAX5 (MISCELLANEOUS)
APR CLP MED LRG 5 ANG JAW (MISCELLANEOUS)
BENZOIN TINCTURE PRP APPL 2/3 (GAUZE/BANDAGES/DRESSINGS) ×3 IMPLANT
CANISTER SUCTION 2500CC (MISCELLANEOUS) ×2 IMPLANT
CHLORAPREP W/TINT 26ML (MISCELLANEOUS) ×3 IMPLANT
CLIP APPLIE 5 13 M/L LIGAMAX5 (MISCELLANEOUS) IMPLANT
CLOSURE STERI-STRIP 1/2X4 (GAUZE/BANDAGES/DRESSINGS) ×1
CLOSURE WOUND 1/2 X4 (GAUZE/BANDAGES/DRESSINGS) ×1
CLSR STERI-STRIP ANTIMIC 1/2X4 (GAUZE/BANDAGES/DRESSINGS) ×1 IMPLANT
COVER SURGICAL LIGHT HANDLE (MISCELLANEOUS) ×3 IMPLANT
DEVICE SECURE STRAP 25 ABSORB (INSTRUMENTS) ×4 IMPLANT
DEVICE TROCAR PUNCTURE CLOSURE (ENDOMECHANICALS) ×3 IMPLANT
DRAPE LAPAROSCOPIC ABDOMINAL (DRAPES) ×3 IMPLANT
ELECT REM PT RETURN 9FT ADLT (ELECTROSURGICAL) ×3
ELECTRODE REM PT RTRN 9FT ADLT (ELECTROSURGICAL) ×1 IMPLANT
GAUZE SPONGE 2X2 8PLY STRL LF (GAUZE/BANDAGES/DRESSINGS) ×1 IMPLANT
GLOVE BIO SURGEON STRL SZ7.5 (GLOVE) ×3 IMPLANT
GLOVE BIOGEL PI IND STRL 7.0 (GLOVE) IMPLANT
GLOVE BIOGEL PI INDICATOR 7.0 (GLOVE) ×2
GLOVE SURG SS PI 7.0 STRL IVOR (GLOVE) ×2 IMPLANT
GOWN STRL REUS W/ TWL LRG LVL3 (GOWN DISPOSABLE) ×2 IMPLANT
GOWN STRL REUS W/ TWL XL LVL3 (GOWN DISPOSABLE) ×1 IMPLANT
GOWN STRL REUS W/TWL LRG LVL3 (GOWN DISPOSABLE) ×9
GOWN STRL REUS W/TWL XL LVL3 (GOWN DISPOSABLE) ×6
KIT BASIN OR (CUSTOM PROCEDURE TRAY) ×3 IMPLANT
KIT ROOM TURNOVER OR (KITS) ×3 IMPLANT
MESH VENTRALIGHT ST 6X8 (Mesh Specialty) ×3 IMPLANT
MESH VENTRLGHT ELLIPSE 8X6XMFL (Mesh Specialty) IMPLANT
NDL INSUFFLATION 14GA 120MM (NEEDLE) ×1 IMPLANT
NEEDLE INSUFFLATION 14GA 120MM (NEEDLE) ×3 IMPLANT
NS IRRIG 1000ML POUR BTL (IV SOLUTION) ×3 IMPLANT
PAD ARMBOARD 7.5X6 YLW CONV (MISCELLANEOUS) ×6 IMPLANT
SCALPEL HARMONIC ACE (MISCELLANEOUS) IMPLANT
SCISSORS LAP 5X35 DISP (ENDOMECHANICALS) ×3 IMPLANT
SET IRRIG TUBING LAPAROSCOPIC (IRRIGATION / IRRIGATOR) IMPLANT
SLEEVE ENDOPATH XCEL 5M (ENDOMECHANICALS) ×3 IMPLANT
SPONGE GAUZE 2X2 STER 10/PKG (GAUZE/BANDAGES/DRESSINGS) ×2
STRIP CLOSURE SKIN 1/2X4 (GAUZE/BANDAGES/DRESSINGS) ×1 IMPLANT
SUT CHROMIC 2 0 SH (SUTURE) ×3 IMPLANT
SUT MNCRL AB 3-0 PS2 18 (SUTURE) ×3 IMPLANT
SUT PROLENE 1 CT (SUTURE) ×4 IMPLANT
SUT PROLENE 2 0 KS (SUTURE) ×4 IMPLANT
TAPE CLOTH SURG 4X10 WHT LF (GAUZE/BANDAGES/DRESSINGS) ×2 IMPLANT
TOWEL OR 17X24 6PK STRL BLUE (TOWEL DISPOSABLE) ×3 IMPLANT
TOWEL OR 17X26 10 PK STRL BLUE (TOWEL DISPOSABLE) ×3 IMPLANT
TRAY FOLEY CATH 16FR SILVER (SET/KITS/TRAYS/PACK) IMPLANT
TRAY LAPAROSCOPIC MC (CUSTOM PROCEDURE TRAY) ×3 IMPLANT
TROCAR XCEL NON-BLD 11X100MML (ENDOMECHANICALS) IMPLANT
TROCAR XCEL NON-BLD 5MMX100MML (ENDOMECHANICALS) ×3 IMPLANT
TUBING INSUFFLATION (TUBING) ×3 IMPLANT

## 2016-03-22 NOTE — Discharge Instructions (Signed)
[facility] _______[facility] [place] [facility] ° °HERNIA REPAIR: POST OP INSTRUCTIONS ° °Always review your discharge instruction sheet given to you by the facility where your surgery was performed. °IF YOU HAVE DISABILITY OR FAMILY LEAVE FORMS, YOU MUST BRING THEM TO THE OFFICE FOR PROCESSING.   °DO NOT GIVE THEM TO YOUR DOCTOR. ° °1. A  prescription for pain medication may be given to you upon discharge.  Take your pain medication as prescribed, if needed.  If narcotic pain medicine is not needed, then you may take acetaminophen (Tylenol) or ibuprofen (Advil) as needed. °2. Take your usually prescribed medications unless otherwise directed. °3. If you need a refill on your pain medication, please contact your pharmacy.  They will contact our office to request authorization. Prescriptions will not be filled after 5 pm or on week-ends. °4. You should follow a light diet the first 24 hours after arrival home, such as soup and crackers, etc.  Be sure to include lots of fluids daily.  Resume your normal diet the day after surgery. °5. Most patients will experience some swelling and bruising around the umbilicus or in the groin and scrotum.  Ice packs and reclining will help.  Swelling and bruising can take several days to resolve.  °6. It is common to experience some constipation if taking pain medication after surgery.  Increasing fluid intake and taking a stool softener (such as Colace) will usually help or prevent this problem from occurring.  A mild laxative (Milk of Magnesia or Miralax) should be taken according to package directions if there are no bowel movements after 48 hours. °7. Unless discharge instructions indicate otherwise, you may remove your bandages 24-48 hours after surgery, and you may shower at that time.  You may have steri-strips (small skin tapes) in place directly over the incision.  These strips should be left on the skin for 7-10 days.  If your surgeon used skin glue on the incision, you may shower  in 24 hours.  The glue will flake off over the next 2-3 weeks.  Any sutures or staples will be removed at the office during your follow-up visit. °8. ACTIVITIES:  You may resume regular (light) daily activities beginning the next day--such as daily self-care, walking, climbing stairs--gradually increasing activities as tolerated.  You may have sexual intercourse when it is comfortable.  Refrain from any heavy lifting or straining until approved by your doctor. °a. You may drive when you are no longer taking prescription pain medication, you can comfortably wear a seatbelt, and you can safely maneuver your car and apply brakes. °b. RETURN TO WORK:  __________________________________________________________ °9. You should see your doctor in the office for a follow-up appointment approximately 2-3 weeks after your surgery.  Make sure that you call for this appointment within a day or two after you arrive home to insure a convenient appointment time. °10. OTHER INSTRUCTIONS:  __________________________________________________________________________________________________________________________________________________________________________________________  °WHEN TO CALL YOUR DOCTOR: °1. Fever over 101.0 °2. Inability to urinate °3. Nausea and/or vomiting °4. Extreme swelling or bruising °5. Continued bleeding from incision. °6. Increased pain, redness, or drainage from the incision ° °The clinic staff is available to answer your questions during regular business hours.  Please don’t hesitate to call and ask to speak to one of the nurses for clinical concerns.  If you have a medical emergency, go to the nearest emergency room or call 911.  A surgeon from [facility] is always on call at the hospital ° ° °[address], [address], [place], [place]  [address] ? ° [address] [address], [place],    [address] °[phone] ? [phone] ? FAX [phone] °Web site: [email or web address] ° °

## 2016-03-22 NOTE — Anesthesia Postprocedure Evaluation (Signed)
Anesthesia Post Note  Patient: Mathew Bridges  Procedure(s) Performed: Procedure(s) (LRB): LAPAROSCOPIC VENTRAL HERNIA (N/A) INSERTION OF MESH (N/A)  Patient location during evaluation: PACU Anesthesia Type: General Level of consciousness: awake and alert Pain management: pain level controlled Vital Signs Assessment: post-procedure vital signs reviewed and stable Respiratory status: spontaneous breathing, nonlabored ventilation, respiratory function stable and patient connected to nasal cannula oxygen Cardiovascular status: blood pressure returned to baseline and stable Postop Assessment: no signs of nausea or vomiting Anesthetic complications: no    Last Vitals:  Filed Vitals:   03/22/16 0930 03/22/16 0943  BP: 170/83 164/87  Pulse: 55 66  Temp:    Resp: 13 17    Last Pain: There were no vitals filed for this visit.               Anushri Casalino L

## 2016-03-22 NOTE — Progress Notes (Signed)
Pt states his stomach hurts when I arouse him, but falls back to sleep before he can finish a sentence.

## 2016-03-22 NOTE — Op Note (Signed)
03/22/2016  8:42 AM  PATIENT:  Mathew Bridges  55 y.o. male  PRE-OPERATIVE DIAGNOSIS:  VENTRAL HERNIA  POST-OPERATIVE DIAGNOSIS:  INCARCERATE VENTRAL HERNIA  PROCEDURE:  Procedure(s): LAPAROSCOPIC VENTRAL HERNIA (N/A) INSERTION OF MESH (N/A)  SURGEON:  Surgeon(s) and Role:    * Axel FillerArmando Berkeley Vanaken, MD - Primary  ANESTHESIA:   local and general  EBL:5cc Total I/O In: 1000 [I.V.:1000] Out: -   BLOOD ADMINISTERED:none  DRAINS: none   LOCAL MEDICATIONS USED:  BUPIVICAINE   SPECIMEN:  No Specimen  DISPOSITION OF SPECIMEN:  N/A  COUNTS:  YES  TOURNIQUET:  * No tourniquets in log *  DICTATION: .Dragon Dictation  Details of the procedure:   After the patient was consented patient was taken back to the operating room patient was then placed in supine position bilateral SCDs in place.  The patient was prepped and draped in the usual sterile fashion. After antibiotics were confirmed a timeout was called and all facts were verified. The Veress needle technique was used to insuflate the abdomen at Palmer's point. The abdomen was insufflated to 14 mm mercury. Subsequently a 5 mm trocar was placed a camera inserted there was no injury to any intra-abdominal organs.    There was seen to be an incarcerated  3cm ventral hernia.  A second camera port was in placed into the left lower quadrant.   At this the Falicform ligament was taken down with Bovie cautery maintaining hemostasis. A 5mm port was placed in the epigastrium.   I proceeded to reduce the hernia contents.  Once the hernia was cleared away, an oval Bard Ventralight 15.2 x 20.3cm  mesh was inserted into the abdomen.  The mesh was secured circumferentially with am Securestrap tacker in a double crown fashion.  2-0 prolenes were used at 3:00, 6:00, 9:00, and 12:00 as transfascial sutures using a endoclose decive.    The omentum was brought over the area of the mesh. The pneumoperitoneum was evacuated  & all trocars  were removed. The  skin was reapproximated with 4-0  Monocryl sutures in a subcuticular fashion. The skin was dressed with Steri-Strips tape and gauze.  The patient was taken to the recovery room in stable condition.   PLAN OF CARE: Discharge to home after PACU  PATIENT DISPOSITION:  PACU - hemodynamically stable.   Delay start of Pharmacological VTE agent (>24hrs) due to surgical blood loss or risk of bleeding: not applicable

## 2016-03-22 NOTE — H&P (Signed)
History of Present Illness  Patient words: 3 month hernia check.  The patient is a 55 year old male who presents with an incisional hernia. Patient comes in today 3 months after his initial visit for a ventral likely incisional hernia. Patient initially was smoking 10 cigarettes a day. He states is down to approximately 3 cigarettes. I discussed the patient he should continue to decrease his smoking and quit entirely.  The patient states that he's been to the ER secondary to the incarcerated hernia and pain. This was reduced and his pain resolved.  The patient works with heavy lifting at work, greater than 50 pounds. ___________________________________  patient is a 55 year old male who is referred by [facility] for evaluation of an umbilical hernia. Patient states that this tear for at least 5 years. Patient states that at laparoscopic cholecystectomy multiple years ago. Patient presented to the ER secondary to pain. This was reducible there.  Patient has no other medical issues. Patient currently smokes 10 cigarettes a day.   Other Problems  VENTRAL HERNIA WITHOUT OBSTRUCTION OR GANGRENE (K43.9)10/27/2015  Past Surgical History  No pertinent past surgical history  Diagnostic Studies History Colonoscopy never  Allergies  No Known Drug Allergies01/30/2017  Medication History  Multiple Vitamin (Oral) Active. Medications Reconciled  Social History  Caffeine use Coffee. Tobacco use Current every day smoker.  Family History No pertinent family history First Degree Relatives    Review of Systems  General Not Present- Appetite Loss, Chills, Fatigue, Fever, Night Sweats, Weight Gain and Weight Loss. Skin Present- Change in Wart/Mole. Not Present- Dryness, Hives, Jaundice, New Lesions, Non-Healing Wounds, Rash and Ulcer. HEENT Not Present- Earache, Hearing Loss, Hoarseness, Nose Bleed, Oral Ulcers, Ringing in the Ears, Seasonal Allergies, Sinus Pain, Sore  Throat, Visual Disturbances, Wears glasses/contact lenses and Yellow Eyes. Respiratory Not Present- Bloody sputum, Chronic Cough, Difficulty Breathing, Snoring and Wheezing. Breast Not Present- Breast Mass, Breast Pain, Nipple Discharge and Skin Changes. Cardiovascular Not Present- Chest Pain, Difficulty Breathing Lying Down, Leg Cramps, Palpitations, Rapid Heart Rate, Shortness of Breath and Swelling of Extremities. Gastrointestinal Not Present- Abdominal Pain, Bloating, Bloody Stool, Change in Bowel Habits, Chronic diarrhea, Constipation, Difficulty Swallowing, Excessive gas, Gets full quickly at meals, Hemorrhoids, Indigestion, Nausea, Rectal Pain and Vomiting. Male Genitourinary Not Present- Blood in Urine, Change in Urinary Stream, Frequency, Impotence, Nocturia, Painful Urination, Urgency and Urine Leakage. Musculoskeletal Not Present- Back Pain, Joint Pain, Joint Stiffness, Muscle Pain, Muscle Weakness and Swelling of Extremities. Neurological Not Present- Decreased Memory, Fainting, Headaches, Numbness, Seizures, Tingling, Tremor, Trouble walking and Weakness. Psychiatric Not Present- Anxiety, Bipolar, Change in Sleep Pattern, Depression, Fearful and Frequent crying. Endocrine Not Present- Cold Intolerance, Excessive Hunger, Hair Changes, Heat Intolerance and New Diabetes. Hematology Not Present- Easy Bruising, Excessive bleeding, Gland problems, HIV and Persistent Infections.  Vitals 01/21/2016 11:19 AM Weight: 178.5 lb Height: 66in Body Surface Area: 1.91 m Body Mass Index: 28.81 kg/m  BP: 130/86 (Sitting, Left Arm, Standard)       Physical Exam  General Mental Status-Alert. General Appearance-Consistent with stated age. Hydration-Well hydrated. Voice-Normal.  Head and Neck Head-normocephalic, atraumatic with no lesions or palpable masses. Trachea-midline. Thyroid Gland Characteristics - normal size and consistency.  Chest and Lung Exam Chest and  lung exam reveals -quiet, even and easy respiratory effort with no use of accessory muscles and on auscultation, normal breath sounds, no adventitious sounds and normal vocal resonance. Inspection Chest Wall - Normal. Back - normal.  Cardiovascular Cardiovascular examination reveals -normal heart  sounds, regular rate and rhythm with no murmurs and normal pedal pulses bilaterally.  Abdomen Inspection Skin - Scar - no surgical scars. Hernias - Ventral hernia - Reducible(Just superior to the umbilicus, approximately 2 cm in size.). Palpation/Percussion Normal exam - Soft, Non Tender, No Rebound tenderness, No Rigidity (guarding) and No hepatosplenomegaly. Auscultation Normal exam - Bowel sounds normal.    Assessment & Plan  VENTRAL HERNIA WITHOUT OBSTRUCTION OR GANGRENE (K43.9) Impression: 55 year old male with a ventral hernia 1. The patient will like to proceed to the operating room for laparoscopic umbilical hernia repair with mesh.  2. I discussed with the patient the risks and benefits of the procedure to include but not limited to: Infection, bleeding, damage to surrounding structures, possible need for further surgery, possible nerve pain, and possible recurrence. The patient was understanding and wishes to proceed.

## 2016-03-22 NOTE — Transfer of Care (Signed)
Immediate Anesthesia Transfer of Care Note  Patient: Mathew Bridges  Procedure(s) Performed: Procedure(s): LAPAROSCOPIC VENTRAL HERNIA (N/A) INSERTION OF MESH (N/A)  Patient Location: PACU  Anesthesia Type:General  Level of Consciousness: awake, alert , oriented and patient cooperative  Airway & Oxygen Therapy: Patient Spontanous Breathing and Patient connected to nasal cannula oxygen  Post-op Assessment: Report given to RN, Post -op Vital signs reviewed and stable and Patient moving all extremities X 4  Post vital signs: Reviewed and stable  Last Vitals:  Filed Vitals:   03/22/16 0632  BP: 155/72  Pulse: 53  Temp: 36.8 C  Resp: 18    Last Pain: There were no vitals filed for this visit.       Complications: No apparent anesthesia complications

## 2016-03-22 NOTE — Anesthesia Procedure Notes (Addendum)
Procedure Name: Intubation Date/Time: 03/22/2016 7:41 AM Performed by: Rogelia BogaMUELLER, Azaryah Oleksy P Pre-anesthesia Checklist: Patient identified, Emergency Drugs available, Suction available and Patient being monitored Patient Re-evaluated:Patient Re-evaluated prior to inductionOxygen Delivery Method: Circle system utilized Preoxygenation: Pre-oxygenation with 100% oxygen Intubation Type: IV induction Ventilation: Mask ventilation without difficulty and Oral airway inserted - appropriate to patient size Laryngoscope Size: Mac and 3 Grade View: Grade I Tube type: Oral Tube size: 7.5 mm Number of attempts: 1 Airway Equipment and Method: Stylet Placement Confirmation: ETT inserted through vocal cords under direct vision,  positive ETCO2 and breath sounds checked- equal and bilateral Secured at: 22 cm Tube secured with: Tape Dental Injury: Teeth and Oropharynx as per pre-operative assessment  Comments: Intubation performed by Marty HeckAshley Keenan, SRNA

## 2016-03-23 ENCOUNTER — Encounter (HOSPITAL_COMMUNITY): Payer: Self-pay | Admitting: General Surgery

## 2016-05-04 ENCOUNTER — Encounter (HOSPITAL_COMMUNITY): Payer: Self-pay | Admitting: General Surgery

## 2016-12-10 IMAGING — CR DG ABDOMEN 1V
1 series · 1 of 1 positions shown · non-contrast
Comparison: None.

CLINICAL DATA: Incarcerated umbilical hernia. Status post
reduction.

EXAM:
ABDOMEN - 1 VIEW

[abdomen kub]
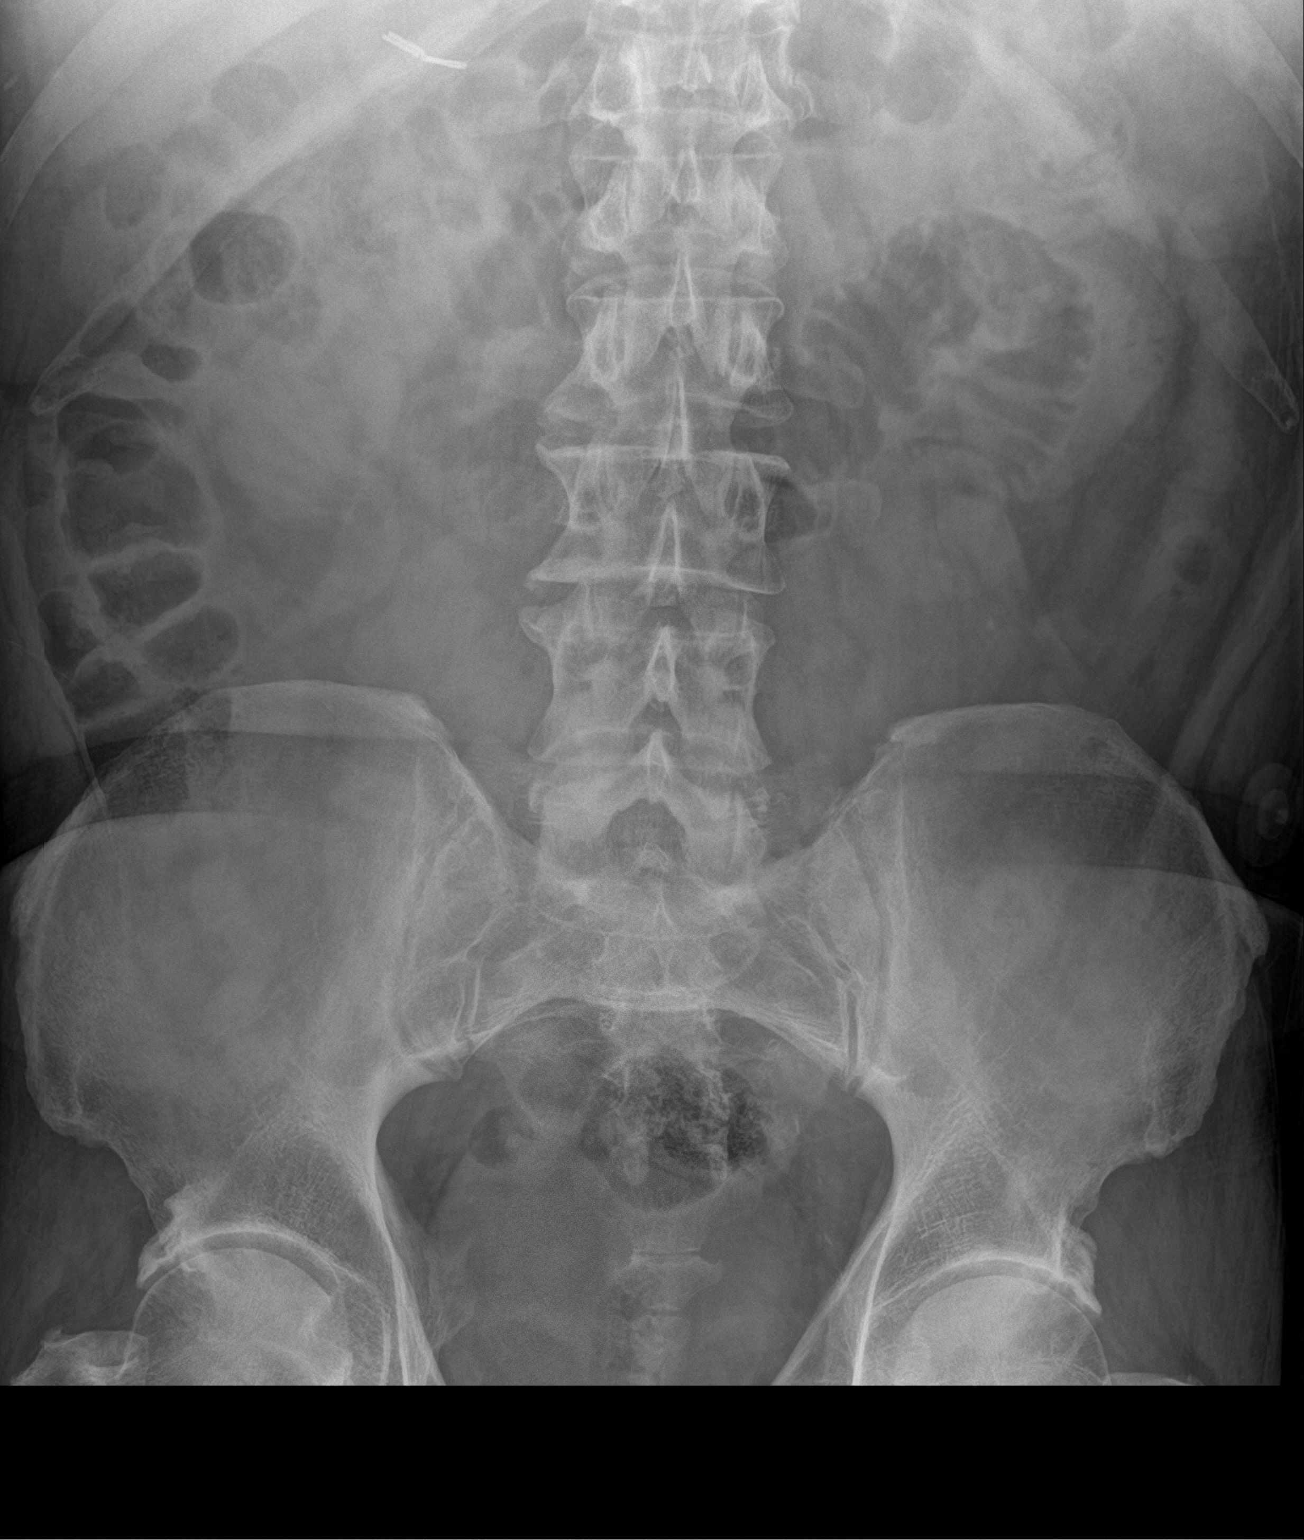

[1 of 1 positions shown; findings below may reference images not displayed]

FINDINGS: The bowel gas pattern is normal. No radio-opaque calculi or other
significant radiographic abnormality are seen.
IMPRESSION: Negative.

## 2018-05-13 ENCOUNTER — Emergency Department (HOSPITAL_COMMUNITY)
Admission: EM | Admit: 2018-05-13 | Discharge: 2018-05-13 | Disposition: A | Discharge status: Home / Self Care | Payer: BLUE CROSS/BLUE SHIELD | Attending: Emergency Medicine | Admitting: Emergency Medicine

## 2018-05-13 ENCOUNTER — Encounter (HOSPITAL_COMMUNITY): Payer: Self-pay | Admitting: Emergency Medicine

## 2018-05-13 DIAGNOSIS — F17210 Nicotine dependence, cigarettes, uncomplicated: Secondary | ICD-10-CM | POA: Insufficient documentation

## 2018-05-13 DIAGNOSIS — L239 Allergic contact dermatitis, unspecified cause: Secondary | ICD-10-CM | POA: Insufficient documentation

## 2018-05-13 DIAGNOSIS — J449 Chronic obstructive pulmonary disease, unspecified: Secondary | ICD-10-CM | POA: Insufficient documentation

## 2018-05-13 DIAGNOSIS — Z79899 Other long term (current) drug therapy: Secondary | ICD-10-CM | POA: Insufficient documentation

## 2018-05-13 MED ORDER — PREDNISONE 10 MG PO TABS: ORAL_TABLET | ORAL | 0 refills | Status: DC

## 2018-05-13 NOTE — ED Triage Notes (Signed)
Pt presents to ED for assessment of rash since Monday.  Itchy, excoriation marks noted.  Pt states he has tried hydrocortisone cream, and Benadryl at home, without relief.

## 2018-05-13 NOTE — ED Notes (Signed)
Patient verbalized understanding of discharge instructions and denies any further needs or questions at this time. VS stable. Patient ambulatory with steady gait.  

## 2018-05-13 NOTE — ED Provider Notes (Signed)
[facility]ENC[facility] Provider Note   CSN: [id number] Arrival date & time: 05/13/18  1612     History   Chief Complaint Chief Complaint  Patient presents with  . Rash    HPI Mathew Bridges is a 57 y.o. male.  57 y.o male with no PMH presents to the ED with a chief complaint of rash x5 days. He states he first noticed the rash along his extremities especially his legs.  Rash has spread all over his body, mainly in his extremities but also parts of his torso.  Patient has tried applying hydrocortisone cream to the area but there is no relieves in symptoms.  He has also tried Benadryl but states no relief in symptoms.  Patient describes the rash is very pruritic.  He denies any fever, airway compromise, chest pain, shortness of breath or abdominal pain.He also denies any changes in soap or detergent along with new food allergy exposures.      Past Medical History:  Diagnosis Date  . Asthma   . COPD (chronic obstructive pulmonary disease) (HCC)   . Shortness of breath dyspnea    with exertion    Patient Active Problem List   Diagnosis Date Noted  . History of bronchitis 12/10/2014  . Excess ear wax 12/10/2014    Past Surgical History:  Procedure Laterality Date  . CHOLECYSTECTOMY    . INSERTION OF MESH N/A 03/22/2016   Procedure: INSERTION OF MESH;  Surgeon: Axel FillerArmando Ramirez, MD;  Location: [facility] OR;  Service: General;  Laterality: N/A;  . VENTRAL HERNIA REPAIR N/A 03/22/2016   Procedure: LAPAROSCOPIC VENTRAL HERNIA;  Surgeon: Axel FillerArmando Ramirez, MD;  Location: [facility] OR;  Service: General;  Laterality: N/A;        Home Medications    Prior to Admission medications   Medication Sig Start Date End Date Taking? Authorizing Provider  albuterol (PROVENTIL HFA;VENTOLIN HFA) 108 (90 BASE) MCG/ACT inhaler Inhale 1-2 puffs into the lungs every 6 (six) hours as needed for wheezing or shortness of breath. 11/29/14   Elson AreasSofia, Leslie K, PA-C  Multiple Vitamin  (MULTIVITAMIN WITH MINERALS) TABS tablet Take 1 tablet by mouth daily.    [provider]  oxyCODONE-acetaminophen (ROXICET) 5-325 MG tablet Take 1-2 tablets by mouth every 4 (four) hours as needed. 03/22/16   Axel Filleramirez, Armando, MD  predniSONE (DELTASONE) 10 MG tablet Take 6 on day 1 Take 5 on day 2 Take 4 on day 3 Take 3 on day 4 Take 2 on day 5 Take 1 on day 6 05/13/18   Claude MangesSoto, Cris Gibby, PA-C    Family History Family History  Problem Relation Age of Onset  . COPD Paternal Grandfather     Social History Social History   Tobacco Use  . Smoking status: Current Every Day Smoker    Packs/day: 1.50    Years: 30.00    Pack years: 45.00    Types: Cigarettes  . Smokeless tobacco: Never Used  Substance Use Topics  . Alcohol use: No  . Drug use: No     Allergies   Patient has no known allergies.   Review of Systems Review of Systems  Constitutional: Negative for chills and fever.  HENT: Negative for ear pain and sore throat.   Eyes: Negative for pain and visual disturbance.  Respiratory: Negative for cough and shortness of breath.   Cardiovascular: Negative for chest pain and palpitations.  Gastrointestinal: Negative for abdominal pain and vomiting.  Genitourinary: Negative for dysuria and hematuria.  Musculoskeletal: Negative for arthralgias and back pain.  Skin: Positive for rash. Negative for color change.  Neurological: Negative for seizures and syncope.  All other systems reviewed and are negative.    Physical Exam Updated Vital Signs BP 131/73 (BP Location: Right Arm)   Pulse 80   Temp 98.8 F (37.1 C) (Oral)   Resp 16   SpO2 100%   Physical Exam  Constitutional: He is oriented to person, place, and time. He appears well-developed and well-nourished.  HENT:  Head: Normocephalic and atraumatic.  Neck: Normal range of motion. Neck supple.  Cardiovascular: Normal heart sounds.  Pulmonary/Chest: Effort normal.  Abdominal: Soft. There is no tenderness.    Musculoskeletal: He exhibits no tenderness.  Neurological: He is alert and oriented to person, place, and time.  Skin: Skin is warm and dry. Capillary refill takes less than 2 seconds. Rash noted. Rash is maculopapular and urticarial.  Nursing note and vitals reviewed.    ED Treatments / Results  Labs (all labs ordered are listed, but only abnormal results are displayed) Labs Reviewed - No data to display  EKG None  Radiology No results found.  Procedures Procedures (including critical care time)  Medications Ordered in ED Medications - No data to display   Initial Impression / Assessment and Plan / ED Course  I have reviewed the triage vital signs and the nursing notes.  Pertinent labs & imaging results that were available during my care of the patient were reviewed by me and considered in my medical decision making (see chart for details).     Patient presents with a pruritic macupapular rash along his extremities trunk and back. Patient has tired oral benadryl and topical hydrocortisone with no relieve.  There is no bulla present, believe pemphigus vulgaris is less likely.  No fever present for sepsis signs, leave SSSS is less likely.  I will treat patient with a steroid taper, patient denies any medical history.  I have given patient a referral for an allergist in order to follow-up for his symptoms and rash.  Also instructed patient to keep a record of any changes in his food, laundry detergent, animals around the home see what he is coming in contact with him developing this rash.  Vitals have been stable throughout the visit.  Vision is stable for discharge.  Return precautions provided.  Final Clinical Impressions(s) / ED Diagnoses   Final diagnoses:  Allergic contact dermatitis, unspecified trigger    ED Discharge Orders         Ordered    predniSONE (DELTASONE) 10 MG tablet     05/13/18 1742           Claude MangesSoto, Christyanna Mckeon, PA-C 05/13/18 1752    Margarita Grizzleay, Danielle,  MD 05/19/18 1146

## 2018-05-13 NOTE — Discharge Instructions (Signed)
I have prescribed a steroid taper for treatment of your rash. Please take as directed on the bottle. You may experience appetite changes, insomnia and abdominal discomfort.If you experience any chest pain, shortness of breath of your symptoms worsen please return to the ED for reevaluation.I have provided a referral to an allergist please follow up with them for evaluation of  your symptoms.

## 2018-09-06 ENCOUNTER — Other Ambulatory Visit: Payer: Self-pay

## 2018-09-06 ENCOUNTER — Emergency Department (HOSPITAL_COMMUNITY): Payer: Self-pay

## 2018-09-06 ENCOUNTER — Emergency Department (HOSPITAL_COMMUNITY)
Admission: EM | Admit: 2018-09-06 | Discharge: 2018-09-06 | Disposition: A | Discharge status: Home / Self Care | Payer: Self-pay | Attending: Emergency Medicine | Admitting: Emergency Medicine

## 2018-09-06 ENCOUNTER — Encounter (HOSPITAL_COMMUNITY): Payer: Self-pay

## 2018-09-06 DIAGNOSIS — R69 Illness, unspecified: Secondary | ICD-10-CM

## 2018-09-06 DIAGNOSIS — F17210 Nicotine dependence, cigarettes, uncomplicated: Secondary | ICD-10-CM | POA: Insufficient documentation

## 2018-09-06 DIAGNOSIS — J449 Chronic obstructive pulmonary disease, unspecified: Secondary | ICD-10-CM | POA: Insufficient documentation

## 2018-09-06 DIAGNOSIS — J111 Influenza due to unidentified influenza virus with other respiratory manifestations: Secondary | ICD-10-CM | POA: Insufficient documentation

## 2018-09-06 LAB — COMPREHENSIVE METABOLIC PANEL
ALT: 31 U/L (ref 0–44)
ANION GAP: 9 (ref 5–15)
AST: 33 U/L (ref 15–41)
Albumin: 4 g/dL (ref 3.5–5.0)
Alkaline Phosphatase: 109 U/L (ref 38–126)
BUN: 8 mg/dL (ref 6–20)
CO2: 23 mmol/L (ref 22–32)
Calcium: 9 mg/dL (ref 8.9–10.3)
Chloride: 104 mmol/L (ref 98–111)
Creatinine, Ser: 0.9 mg/dL (ref 0.61–1.24)
GFR calc non Af Amer: >60 mL/min (ref >60)
Glucose, Bld: 156 mg/dL — ABNORMAL HIGH (ref 70–99)
POTASSIUM: 3.6 mmol/L (ref 3.5–5.1)
Sodium: 136 mmol/L (ref 135–145)
Total Bilirubin: 1.8 mg/dL — ABNORMAL HIGH (ref 0.3–1.2)
Total Protein: 7.6 g/dL (ref 6.5–8.1)

## 2018-09-06 LAB — CBC WITH DIFFERENTIAL/PLATELET
Abs Immature Granulocytes: 0.03 10*3/uL (ref 0.00–0.07)
Basophils Absolute: 0 10*3/uL (ref 0.0–0.1)
Basophils Relative: 0 %
Eosinophils Absolute: 0 10*3/uL (ref 0.0–0.5)
Eosinophils Relative: 1 %
HCT: 48.2 % (ref 39.0–52.0)
Hemoglobin: 16.3 g/dL (ref 13.0–17.0)
Immature Granulocytes: 1 %
Lymphocytes Relative: 6 %
Lymphs Abs: 0.4 10*3/uL — ABNORMAL LOW (ref 0.7–4.0)
MCH: 31.2 pg (ref 26.0–34.0)
MCHC: 33.8 g/dL (ref 30.0–36.0)
MCV: 92.2 fL (ref 80.0–100.0)
Monocytes Absolute: 0.5 10*3/uL (ref 0.1–1.0)
Monocytes Relative: 8 %
NRBC: 0 % (ref 0.0–0.2)
Neutro Abs: 5.5 10*3/uL (ref 1.7–7.7)
Neutrophils Relative %: 84 %
Platelets: 104 10*3/uL — ABNORMAL LOW (ref 150–400)
RBC: 5.23 MIL/uL (ref 4.22–5.81)
RDW: 12.6 % (ref 11.5–15.5)
WBC: 6.5 10*3/uL (ref 4.0–10.5)

## 2018-09-06 LAB — I-STAT CG4 LACTIC ACID, ED: Lactic Acid, Venous: 1.26 mmol/L (ref 0.5–1.9)

## 2018-09-06 MED ORDER — OSELTAMIVIR PHOSPHATE 75 MG PO CAPS: 75.0000 mg | ORAL_CAPSULE | Freq: Once | ORAL | Status: DC

## 2018-09-06 MED ORDER — ACETAMINOPHEN 500 MG PO TABS
1000.0000 mg | ORAL_TABLET | Freq: Once | ORAL | Status: AC
  Administered 2018-09-06: 1000 mg via ORAL
  Filled 2018-09-06: qty 2

## 2018-09-06 MED ORDER — IBUPROFEN 800 MG PO TABS: 800.0000 mg | ORAL_TABLET | Freq: Once | ORAL | Status: DC

## 2018-09-06 MED ORDER — HYDROCODONE-HOMATROPINE 5-1.5 MG/5ML PO SYRP: 5.0000 mL | ORAL_SOLUTION | Freq: Four times a day (QID) | ORAL | 0 refills | Status: DC | PRN

## 2018-09-06 MED ORDER — OSELTAMIVIR PHOSPHATE 75 MG PO CAPS: 75.0000 mg | ORAL_CAPSULE | Freq: Two times a day (BID) | ORAL | 0 refills | Status: DC

## 2018-09-06 NOTE — ED Notes (Signed)
Patient transported to X-ray 

## 2018-09-06 NOTE — Discharge Instructions (Addendum)
1.  You should have a recheck with your doctor within 2 to 3 days.  If you do not have a doctor, use the referral number in your discharge instructions to find one. 2.  Return to the emergency department if your symptoms are worsening.  Return if you are having shortness of breath, chest pain, worsening symptoms. 3.  Take ibuprofen and Tylenol for control of fever and body ache.  You may use hydrocodone syrup as prescribed for severe cough.

## 2018-09-06 NOTE — ED Provider Notes (Signed)
[facility] EMERGENCY DEPARTMENT Provider Note   CSN: [id number] Arrival date & time: 09/06/18  0809     History   Chief Complaint Chief Complaint  Patient presents with  . Fever  . Nasal Congestion    HPI Mathew Bridges is a 57 y.o. male.  HPI Patient reports he developed a fever last night.  He has had chills.  He has developed cough.  Cough is been dry.  Patient denies significant nasal congestion or drainage.  No sore throat or difficulty breathing.  Nausea without vomiting.  No urinary frequency or burning.  No lower extremity swelling.  Patient does smoke but has cut back significantly. Past Medical History:  Diagnosis Date  . Asthma   . COPD (chronic obstructive pulmonary disease) (HCC)   . Shortness of breath dyspnea    with exertion    Patient Active Problem List   Diagnosis Date Noted  . History of bronchitis 12/10/2014  . Excess ear wax 12/10/2014    Past Surgical History:  Procedure Laterality Date  . CHOLECYSTECTOMY    . INSERTION OF MESH N/A 03/22/2016   Procedure: INSERTION OF MESH;  Surgeon: Axel Filler, MD;  Location: [facility] OR;  Service: General;  Laterality: N/A;  . VENTRAL HERNIA REPAIR N/A 03/22/2016   Procedure: LAPAROSCOPIC VENTRAL HERNIA;  Surgeon: Axel Filler, MD;  Location: [facility] OR;  Service: General;  Laterality: N/A;        Home Medications    Prior to Admission medications   Medication Sig Start Date End Date Taking? Authorizing Provider  Phenyleph-Doxylamine-DM-APAP (NYQUIL SEVERE COLD/FLU) 5-6.25-10-325 MG/15ML LIQD Take 15 mLs by mouth as needed.   Yes [provider]  albuterol (PROVENTIL HFA;VENTOLIN HFA) 108 (90 BASE) MCG/ACT inhaler Inhale 1-2 puffs into the lungs every 6 (six) hours as needed for wheezing or shortness of breath. Patient not taking: Reported on 09/06/2018 11/29/14   [name], PA-C  HYDROcodone-homatropine [facility]) 5-1.5 MG/5ML syrup Take 5 mLs by mouth every 6 (six) hours as  needed for cough. 09/06/18   Arby Barrette, MD  oseltamivir (TAMIFLU) 75 MG capsule Take 1 capsule (75 mg total) by mouth every 12 (twelve) hours. 09/06/18   Arby Barrette, MD  oxyCODONE-acetaminophen (ROXICET) 5-325 MG tablet Take 1-2 tablets by mouth every 4 (four) hours as needed. Patient not taking: Reported on 09/06/2018 03/22/16   Axel Filler, MD  predniSONE (DELTASONE) 10 MG tablet Take 6 on day 1 Take 5 on day 2 Take 4 on day 3 Take 3 on day 4 Take 2 on day 5 Take 1 on day 6 Patient not taking: Reported on 09/06/2018 05/13/18   Claude Manges, PA-C    Family History Family History  Problem Relation Age of Onset  . COPD Paternal Grandfather     Social History Social History   Tobacco Use  . Smoking status: Current Every Day Smoker    Packs/day: 0.50    Years: 30.00    Pack years: 15.00    Types: Cigarettes  . Smokeless tobacco: Never Used  Substance Use Topics  . Alcohol use: No  . Drug use: No     Allergies   Patient has no known allergies.   Review of Systems Review of Systems 10 Systems reviewed and are negative for acute change except as noted in the HPI.   Physical Exam Updated Vital Signs BP (!) 101/52   Pulse 79   Temp 99.4 F (37.4 C) (Oral)   Resp (!) 23  Ht 5\' 7"  (1.702 m)   Wt 83.5 kg   SpO2 96%   BMI 28.82 kg/m   Physical Exam  Constitutional: He is oriented to person, place, and time.  Patient is well-nourished well-developed he does have a uncomfortable appearance.  He appears ill.  No respiratory distress.  HENT:  Head: Normocephalic and atraumatic.  Nose: Nose normal.  Mouth/Throat: Oropharynx is clear and moist.  Eyes: Pupils are equal, round, and reactive to light. EOM are normal.  Cardiovascular: Normal rate, regular rhythm, normal heart sounds and intact distal pulses.  Pulmonary/Chest: Effort normal and breath sounds normal.  Abdominal: Soft. He exhibits no distension. There is no tenderness. There is no guarding.    Musculoskeletal: Normal range of motion. He exhibits no edema or tenderness.  Neurological: He is alert and oriented to person, place, and time. He exhibits normal muscle tone. Coordination normal.  Skin: Skin is warm and dry. Rash noted.  Patient has some small, round wheal-like lesions in the scalp and on the left temple.  Also some nodular more violaceous lesions behind the right ear.  (Patient's wife reports he has had been there for quite some time and they are due to see a dermatologist.  Nothing she is done is ever helped him.)  Psychiatric: He has a normal mood and affect.     ED Treatments / Results  Labs (all labs ordered are listed, but only abnormal results are displayed) Labs Reviewed  COMPREHENSIVE METABOLIC PANEL - Abnormal; Notable for the following components:      Result Value   Glucose, Bld 156 (*)    Total Bilirubin 1.8 (*)    All other components within normal limits  CBC WITH DIFFERENTIAL/PLATELET - Abnormal; Notable for the following components:   Platelets 104 (*)    Lymphs Abs 0.4 (*)    All other components within normal limits  CULTURE, BLOOD (ROUTINE X 2)  CULTURE, BLOOD (ROUTINE X 2)  URINALYSIS, ROUTINE W REFLEX MICROSCOPIC  I-STAT CG4 LACTIC ACID, ED    EKG EKG Interpretation  Date/Time:  Wednesday September 06 2018 08:18:00 EST Ventricular Rate:  94 PR Interval:    QRS Duration: 94 QT Interval:  342 QTC Calculation: 428 R Axis:   20 Text Interpretation:  Sinus rhythm tachycardia otherwise normal Confirmed by Arby Barrette [phone]) on 09/06/2018 12:19:13 PM   Radiology Dg Chest 2 View  Result Date: 09/06/2018 CLINICAL DATA:  Fever EXAM: CHEST - 2 VIEW COMPARISON:  November 29, 2014 FINDINGS: The lungs are clear. The heart size and pulmonary vascularity are normal. No adenopathy. There is degenerative change in the thoracic spine. IMPRESSION: No edema or consolidation. Electronically Signed   By: Bretta Bang III M.D.   On: 09/06/2018 09:02     Procedures Procedures (including critical care time)  Medications Ordered in ED Medications  oseltamivir (TAMIFLU) capsule 75 mg (has no administration in time range)  ibuprofen (ADVIL,MOTRIN) tablet 800 mg (has no administration in time range)  acetaminophen (TYLENOL) tablet 1,000 mg (1,000 mg Oral Given 09/06/18 0853)     Initial Impression / Assessment and Plan / ED Course  I have reviewed the triage vital signs and the nursing notes.  Pertinent labs & imaging results that were available during my care of the patient were reviewed by me and considered in my medical decision making (see chart for details).     Patient presents with acute onset of febrile illness with URI symptoms and cough.  Patient's diagnostic work-up is within  normal limits and vital signs are normal.  At this time, this appears most consistent with influenza-like illness.  Patient is currently stable for home management.  Counseling has been given regarding use of ibuprofen and Tylenol for control of fever and body aches.  Patient symptoms started acutely yesterday, he is a candidate for Tamiflu which has been prescribed.  He has been given good Rx coupons for Tamiflu and hydrocodone syrup.  Final Clinical Impressions(s) / ED Diagnoses   Final diagnoses:  Influenza-like illness    ED Discharge Orders         Ordered    oseltamivir (TAMIFLU) 75 MG capsule  Every 12 hours     09/06/18 1300    HYDROcodone-homatropine (HYCODAN) 5-1.5 MG/5ML syrup  Every 6 hours PRN     09/06/18 1300           Arby BarrettePfeiffer, Lennin Osmond, MD 09/06/18 1304

## 2018-09-06 NOTE — ED Triage Notes (Signed)
Pt arives to ED with complaints of fever and nasal congestion since last night. Pt states he did not take his temperature but feels hot, pt took robitussin prior to arrival. Pt has oral temp of 101.2 upon arrival. Pt placed in position of comfort with call bell in reach, bed locked and lowered.

## 2018-09-06 NOTE — ED Notes (Signed)
Patient verbalizes understanding of discharge instructions. Opportunity for questioning and answers were provided. Armband removed by staff, pt discharged from ED ambulatory.   

## 2018-09-11 LAB — CULTURE, BLOOD (ROUTINE X 2)
Culture: NO GROWTH
Culture: NO GROWTH
Special Requests: ADEQUATE

## 2018-10-10 ENCOUNTER — Encounter (HOSPITAL_COMMUNITY): Payer: Self-pay

## 2018-10-10 ENCOUNTER — Ambulatory Visit (HOSPITAL_COMMUNITY)
Admission: EM | Admit: 2018-10-10 | Discharge: 2018-10-10 | Disposition: A | Discharge status: Home / Self Care | Payer: BLUE CROSS/BLUE SHIELD | Attending: Emergency Medicine | Admitting: Emergency Medicine

## 2018-10-10 DIAGNOSIS — L739 Follicular disorder, unspecified: Secondary | ICD-10-CM | POA: Insufficient documentation

## 2018-10-10 MED ORDER — DOXYCYCLINE HYCLATE 100 MG PO CAPS: 100.0000 mg | ORAL_CAPSULE | Freq: Two times a day (BID) | ORAL | 0 refills | Status: AC

## 2018-10-10 MED ORDER — CHLORHEXIDINE GLUCONATE 4 % EX LIQD: Freq: Every day | CUTANEOUS | 0 refills | Status: DC | PRN

## 2018-10-10 MED ORDER — MUPIROCIN 2 % EX OINT: 1.0000 "application " | TOPICAL_OINTMENT | Freq: Three times a day (TID) | CUTANEOUS | 0 refills | Status: DC

## 2018-10-10 NOTE — ED Provider Notes (Signed)
HPI  SUBJECTIVE:  Mathew Bridges is a 58 y.o. male who presents with several weeks of painful, sore "bumps" on his head after getting a haircut with some clippers.  States that he has pain after he "pops" the bumps.  Reports itching, burning when he pulls off scabs.  He reports purulent drainage when he pops the pustules.  No fevers.  No new shampoos, conditioners.  He tried a triple antibiotic ointment without improvement in his symptoms.  No other aggravating or alleviating factors.  Past medical history negative for diabetes.  PMD: None.  Past Medical History:  Diagnosis Date  . Asthma   . COPD (chronic obstructive pulmonary disease) (HCC)   . Shortness of breath dyspnea    with exertion    Past Surgical History:  Procedure Laterality Date  . CHOLECYSTECTOMY    . INSERTION OF MESH N/A 03/22/2016   Procedure: INSERTION OF MESH;  Surgeon: Axel Filler, MD;  Location: [facility] OR;  Service: General;  Laterality: N/A;  . VENTRAL HERNIA REPAIR N/A 03/22/2016   Procedure: LAPAROSCOPIC VENTRAL HERNIA;  Surgeon: Axel Filler, MD;  Location: [facility] OR;  Service: General;  Laterality: N/A;    Family History  Problem Relation Age of Onset  . COPD Paternal Grandfather   . Healthy Mother   . Healthy Father     Social History   Tobacco Use  . Smoking status: Current Every Day Smoker    Packs/day: 0.50    Years: 30.00    Pack years: 15.00    Types: Cigarettes  . Smokeless tobacco: Never Used  Substance Use Topics  . Alcohol use: No  . Drug use: No    No current facility-administered medications for this encounter.   Current Outpatient Medications:  .  chlorhexidine (HIBICLENS) 4 % external liquid, Apply topically daily as needed. Dilute 10-15 mL in water, Use daily when bathing for 1-2 weeks, Disp: 120 mL, Rfl: 0 .  doxycycline (VIBRAMYCIN) 100 MG capsule, Take 1 capsule (100 mg total) by mouth 2 (two) times daily for 7 days., Disp: 14 capsule, Rfl: 0 .  mupirocin ointment (BACTROBAN)  2 %, Apply 1 application topically 3 (three) times daily., Disp: 22 g, Rfl: 0  No Known Allergies   ROS  As noted in HPI.   Physical Exam  BP (!) 140/100   Pulse (!) 56   Temp 98 F (36.7 C)   Resp 19   SpO2 99%   Constitutional: Well developed, well nourished, no acute distress Eyes:  EOMI, conjunctiva normal bilaterally HENT: Normocephalic, atraumatic,mucus membranes moist Respiratory: Normal inspiratory effort Cardiovascular: Normal rate GI: nondistended skin: tender erythematous papular rash over scalp with some crusting consistent with folliculitis see picture.   Neck: Positive posterior cervical lymphadenopathy right side.  No  preauricular, postauricular, anterior cervical lymphadenopathy Musculoskeletal: no deformities Neurologic: Alert & oriented x 3, no focal neuro deficits Psychiatric: Speech and behavior appropriate   ED Course   Medications - No data to display  No orders of the defined types were placed in this encounter.   No results found for this or any previous visit (from the past 24 hour(s)). No results found.  ED Clinical Impression  Folliculitis   ED Assessment/Plan  Patient with a folliculitis.  Home with doxycycline for 7 days, chlorhexidine, Bactroban, Claritin or Zyrtec as needed for itching and a primary care referral list.  Discussed  MDM, treatment plan, and plan for follow-up with patient. Discussed sn/sx that should prompt return to the ED.  patient agrees with plan.   Meds ordered this encounter  Medications  . doxycycline (VIBRAMYCIN) 100 MG capsule    Sig: Take 1 capsule (100 mg total) by mouth 2 (two) times daily for 7 days.    Dispense:  14 capsule    Refill:  0  . chlorhexidine (HIBICLENS) 4 % external liquid    Sig: Apply topically daily as needed. Dilute 10-15 mL in water, Use daily when bathing for 1-2 weeks    Dispense:  120 mL    Refill:  0  . mupirocin ointment (BACTROBAN) 2 %    Sig: Apply 1 application  topically 3 (three) times daily.    Dispense:  22 g    Refill:  0    *This clinic note was created using Scientist, clinical (histocompatibility and immunogenetics). Therefore, there may be occasional mistakes despite careful proofreading.   ?   Domenick Gong, MD 10/10/18 2117

## 2018-10-10 NOTE — ED Triage Notes (Signed)
Pt presents with complaints of bumps all over his head "for awhile". Denies any other complaints.

## 2018-10-10 NOTE — Discharge Instructions (Addendum)
Finish the doxycycline, even if you feel better.  Apply the Bactroban as directed.  The chlorhexidine soap will help keep your scalp clean.  Follow-up with 1 of the primary care physicians listed below as needed for routine care.  Below is a list of primary care practices who are taking new patients for you to follow-up with. [facility] [address] [place], [place] [address] [phone]  [facility] [phone] [address] [place] [address]  [facility]: [address] [place] [address]  [phone]  [facility]: [address] [place] [address]   [phone]  [facility]: [address] [place] [address]  [phone]  [facility] : [address] [place] [address] [phone]  [facility]: [address] [place] [address] [phone]  [facility]: [address] [place] [address] [phone]  Dr. Oneal Grout [address] [facility] [place] [address]  [phone]  Dr. Jackie Plum, [facility]. [address] [place], [place] [address]  [phone]  Go to www.goodrx.com to look up your medications. This will give you a list of where you can find your prescriptions at the most affordable prices. Or ask the pharmacist what the cash price is, or if they have any other discount programs available to help make your medication more affordable. This can be less expensive than what you would pay with insurance.

## 2019-02-20 ENCOUNTER — Telehealth: Payer: Self-pay

## 2019-02-20 ENCOUNTER — Ambulatory Visit: Payer: BLUE CROSS/BLUE SHIELD | Attending: Nurse Practitioner | Admitting: Nurse Practitioner

## 2019-02-20 ENCOUNTER — Other Ambulatory Visit: Payer: Self-pay

## 2019-02-20 ENCOUNTER — Encounter: Payer: Self-pay | Admitting: Nurse Practitioner

## 2019-02-20 VITALS — BP 131/67 | HR 61 | Temp 98.3°F | Ht 66.0 in | Wt 169.0 lb

## 2019-02-20 DIAGNOSIS — L818 Other specified disorders of pigmentation: Secondary | ICD-10-CM

## 2019-02-20 DIAGNOSIS — L739 Follicular disorder, unspecified: Secondary | ICD-10-CM

## 2019-02-20 DIAGNOSIS — Z1211 Encounter for screening for malignant neoplasm of colon: Secondary | ICD-10-CM

## 2019-02-20 DIAGNOSIS — J449 Chronic obstructive pulmonary disease, unspecified: Secondary | ICD-10-CM

## 2019-02-20 DIAGNOSIS — R7303 Prediabetes: Secondary | ICD-10-CM | POA: Diagnosis not present

## 2019-02-20 DIAGNOSIS — L718 Other rosacea: Secondary | ICD-10-CM

## 2019-02-20 DIAGNOSIS — L719 Rosacea, unspecified: Secondary | ICD-10-CM

## 2019-02-20 DIAGNOSIS — L738 Other specified follicular disorders: Secondary | ICD-10-CM

## 2019-02-20 DIAGNOSIS — L989 Disorder of the skin and subcutaneous tissue, unspecified: Secondary | ICD-10-CM | POA: Insufficient documentation

## 2019-02-20 DIAGNOSIS — L819 Disorder of pigmentation, unspecified: Secondary | ICD-10-CM

## 2019-02-20 LAB — POCT GLYCOSYLATED HEMOGLOBIN (HGB A1C): Hemoglobin A1C: 5.8 % — AB (ref 4.0–5.6)

## 2019-02-20 LAB — GLUCOSE, POCT (MANUAL RESULT ENTRY): POC Glucose: 102 mg/dl — AB (ref 70–99)

## 2019-02-20 MED ORDER — MUPIROCIN 2 % EX OINT: TOPICAL_OINTMENT | CUTANEOUS | 0 refills | Status: DC

## 2019-02-20 MED ORDER — METRONIDAZOLE 1 % EX GEL: CUTANEOUS | 0 refills | Status: DC

## 2019-02-20 MED ORDER — ALBUTEROL SULFATE HFA 108 (90 BASE) MCG/ACT IN AERS: 2.0000 | INHALATION_SPRAY | Freq: Four times a day (QID) | RESPIRATORY_TRACT | 0 refills | Status: DC | PRN

## 2019-02-20 MED ORDER — DOXYCYCLINE HYCLATE 100 MG PO TABS: 100.0000 mg | ORAL_TABLET | Freq: Two times a day (BID) | ORAL | 0 refills | Status: AC

## 2019-02-20 MED ORDER — CHLORHEXIDINE GLUCONATE 4 % EX LIQD: Freq: Every day | CUTANEOUS | 0 refills | Status: DC | PRN

## 2019-02-20 MED FILL — MUPIROCIN 2% OINTMENT: 2 | 7 days supply | Qty: 22 | Fill #0

## 2019-02-20 MED FILL — DOXYCYCLINE HYCLATE 100 MG: 100 | 10 days supply | Qty: 20 | Fill #0

## 2019-02-20 NOTE — Telephone Encounter (Signed)
Due to patient's high deductible plan the cost of Metrogel or comparable options like Finacea or Brynda Greathouse is too high. Are there any other products you can prescribe or recommendations you can make to treat his rosacea?

## 2019-02-20 NOTE — Progress Notes (Signed)
Assessment & Plan:  Link Snufferddie was seen today for new patient (initial visit).  Diagnoses and all orders for this visit:  Prediabetes -     Glucose (CBG) -     HgB A1c -     CBC Controlled Continue medications as prescribed.  Continue blood sugar control as discussed in office today, low carbohydrate diet, and regular physical exercise as tolerated, 150 minutes per week (30 min each day, 5 days per week, or 50 min 3 days per week). Keep blood sugar logs with fasting goal of 90-130 mg/dl, post prandial (after you eat) less than 180.  For Hypoglycemia: BS <60 and Hyperglycemia BS >400; contact the clinic ASAP. Annual eye exams and foot exams are recommended.   Colon cancer screening -     Ambulatory referral to Gastroenterology  Change in multiple pigmented skin lesions -     Ambulatory referral to Dermatology  Rosacea, acne -     metroNIDAZOLE (METROGEL) 1 % gel; Apply topically to face daily  Folliculitis -     chlorhexidine (HIBICLENS) 4 % external liquid; Apply topically daily as needed. Dilute 10-15 mL in water, Use daily when bathing for 1-2 weeks -     mupirocin ointment (BACTROBAN) 2 %; Apply topically to scalp 2 times per day -     Ambulatory referral to Dermatology -     doxycycline (VIBRA-TABS) 100 MG tablet; Take 1 tablet (100 mg total) by mouth 2 (two) times daily for 10 days.  COPD without exacerbation (HCC) -     albuterol (VENTOLIN HFA) 108 (90 Base) MCG/ACT inhaler; Inhale 2 puffs into the lungs every 6 (six) hours as needed for wheezing or shortness of breath. Encouraged to stop smoking    Patient has been counseled on age-appropriate routine health concerns for screening and prevention. These are reviewed and up-to-date. Referrals have been placed accordingly. Immunizations are up-to-date or declined.    Subjective:   Chief Complaint  Patient presents with   New Patient (Initial Visit)    Pt. is asking for medication refills.   HPI Shella Maximddie W Kindley 58 y.o.  male presents to office today to re establish care.  He has a history of folliculitis which was treated by an ED physician 10-10-2018 with doxycycline, Bactroban and Hibiclens.  Past medical history also includes asthma/COPD.  I do not see a current SABA on his MAR however he had previously been prescribed an albuterol inhaler in 2016 which he no longer has. Will refill today. He continues to smoke. O2 sats 95% on RA today. He denies any increased shortness of breath or worsening productive cough.   Folliculitis Onset last year. He also started seeing a new barber about 1.5 years ago. Endorses bumps in the back of his head which although have improved, have not completely resolved. Bumps are tender and pruritic.  Endorses pain after "popping the bumps in the back of his head". Denies fevers. Endorses relief with bactroban, hibiclens and previous doxycycline. History of prediabetes only.    COPD  Continues to smoke. 1/2 pack of cigarettes per day. Smoker for 30 years. Symptoms non-productive cough does not worsen with exertion.  Denies fever.  Patient uses 1 pillows at night. Patient currently is not on home oxygen therapy. Respiratory history: asthma, occasional episodes of bronchitis and COPD.   Prediabetes Well controlled. Not taking any oral diabetic agents. Denies any hypo or hyperglycemic symptoms.  Lab Results  Component Value Date   HGBA1C 5.8 (A) 02/20/2019  Lab Results  Component Value Date   HGBA1C 5.8 (H) 12/10/2014    Rosacea Patient complains of increasing redness and ance on face. Patient has noted papules or pustules.  Therapy thus far has included None. Prior medication side effects: NONE.   Dermatological Issues Needs to be seen by Dermatology for abnormal skin lesions on face and right side of back.          Review of Systems  Constitutional: Negative for fever, malaise/fatigue and weight loss.  HENT: Negative.  Negative for nosebleeds.   Eyes: Negative.   Negative for blurred vision, double vision and photophobia.  Respiratory: Positive for cough. Negative for hemoptysis, sputum production, shortness of breath and wheezing.   Cardiovascular: Negative.  Negative for chest pain, palpitations and leg swelling.  Gastrointestinal: Negative.  Negative for heartburn, nausea and vomiting.  Musculoskeletal: Negative.  Negative for myalgias.  Skin: Positive for itching and rash.  Neurological: Negative.  Negative for dizziness, focal weakness, seizures and headaches.  Psychiatric/Behavioral: Negative.  Negative for suicidal ideas.    Past Medical History:  Diagnosis Date   Asthma    COPD (chronic obstructive pulmonary disease) (HCC)    Folliculitis    Prediabetes    Shortness of breath dyspnea    with exertion    Past Surgical History:  Procedure Laterality Date   CHOLECYSTECTOMY     INSERTION OF MESH N/A 03/22/2016   Procedure: INSERTION OF MESH;  Surgeon: Axel Filler, MD;  Location: [facility];  Service: General;  Laterality: N/A;   VENTRAL HERNIA REPAIR N/A 03/22/2016   Procedure: LAPAROSCOPIC VENTRAL HERNIA;  Surgeon: Axel Filler, MD;  Location: [facility] OR;  Service: General;  Laterality: N/A;    Family History  Problem Relation Age of Onset   COPD Paternal Grandfather    Healthy Mother    Healthy Father     Social History Reviewed with no changes to be made today.   Outpatient Medications Prior to Visit  Medication Sig Dispense Refill   chlorhexidine (HIBICLENS) 4 % external liquid Apply topically daily as needed. Dilute 10-15 mL in water, Use daily when bathing for 1-2 weeks 120 mL 0   mupirocin ointment (BACTROBAN) 2 % Apply 1 application topically 3 (three) times daily. 22 g 0   No facility-administered medications prior to visit.     No Known Allergies     Objective:    BP 131/67 (BP Location: Left Arm, Patient Position: Sitting, Cuff Size: Normal)    Pulse 61    Temp 98.3 F (36.8 C) (Oral)    Ht 5\' 6"   (1.676 m)    Wt 169 lb (76.7 kg)    SpO2 95%    BMI 27.28 kg/m  Wt Readings from Last 3 Encounters:  02/20/19 169 lb (76.7 kg)  09/06/18 184 lb (83.5 kg)  03/22/16 182 lb 6.4 oz (82.7 kg)    Physical Exam Vitals signs and nursing note reviewed.  Constitutional:      Appearance: He is well-developed.  HENT:     Head: Normocephalic and atraumatic.  Neck:     Musculoskeletal: Normal range of motion.  Cardiovascular:     Rate and Rhythm: Normal rate and regular rhythm.     Heart sounds: Normal heart sounds. No murmur. No friction rub. No gallop.   Pulmonary:     Effort: Pulmonary effort is normal. No tachypnea, bradypnea or respiratory distress.     Breath sounds: Normal breath sounds. No decreased breath sounds, wheezing, rhonchi or rales.  Chest:     Chest wall: No tenderness.  Abdominal:     General: Bowel sounds are normal.     Palpations: Abdomen is soft.  Musculoskeletal: Normal range of motion.  Skin:    General: Skin is warm and dry.     Findings: Acne, lesion and rash present. Rash is crusting and macular.  Neurological:     Mental Status: He is alert and oriented to person, place, and time.     Coordination: Coordination normal.  Psychiatric:        Behavior: Behavior normal. Behavior is cooperative.        Thought Content: Thought content normal.        Judgment: Judgment normal.          Patient has been counseled extensively about nutrition and exercise as well as the importance of adherence with medications and regular follow-up. The patient was given clear instructions to go to ER or return to medical center if symptoms don't improve, worsen or new problems develop. The patient verbalized understanding.   Follow-up: Return in about 4 months (around 06/23/2019) for F/U.   Claiborne Rigg, FNP-BC [facility] and [facility] [place], [place] [phone]   02/20/2019, 1:25 PM

## 2019-02-21 LAB — CBC
Hematocrit: 45.8 % (ref 37.5–51.0)
Hemoglobin: 16.5 g/dL (ref 13.0–17.7)
MCH: 31.9 pg (ref 26.6–33.0)
MCHC: 36 g/dL — ABNORMAL HIGH (ref 31.5–35.7)
MCV: 88 fL (ref 79–97)
Platelets: 134 10*3/uL — ABNORMAL LOW (ref 150–450)
RBC: 5.18 x10E6/uL (ref 4.14–5.80)
RDW: 12.7 % (ref 11.6–15.4)
WBC: 6.7 10*3/uL (ref 3.4–10.8)

## 2019-02-22 ENCOUNTER — Telehealth: Payer: Self-pay

## 2019-02-22 NOTE — Telephone Encounter (Signed)
-----   Message from Claiborne Rigg, NP sent at 02/21/2019  1:20 PM EDT ----- Labs do not show anemia

## 2019-02-22 NOTE — Telephone Encounter (Signed)
CMA attempt to reach patient to inform on results.  No answer and left a VM for patient to call back.  

## 2019-02-27 ENCOUNTER — Other Ambulatory Visit: Payer: Self-pay | Admitting: Nurse Practitioner

## 2019-02-27 NOTE — Telephone Encounter (Signed)
No I will refer him to dermatology

## 2019-04-10 MED FILL — PEG-3350 AND ELECTROLYTES S: 236 | 1 days supply | Qty: 4000 | Fill #0

## 2019-04-11 MED FILL — KETOCONAZOLE 2% SHAMPOO: 2 | 15 days supply | Qty: 120 | Fill #0

## 2019-04-11 MED FILL — DOXYCYCLINE HYC 50 MG CAP: 50 | 30 days supply | Qty: 60 | Fill #0

## 2019-06-15 MED FILL — KETOCONAZOLE 2% SHAMPOO: 2 | 15 days supply | Qty: 120 | Fill #1

## 2019-06-15 MED FILL — DOXYCYCLINE HYC 50 MG CAP: 50 | 30 days supply | Qty: 60 | Fill #1

## 2019-06-26 ENCOUNTER — Ambulatory Visit: Payer: BLUE CROSS/BLUE SHIELD | Admitting: Nurse Practitioner

## 2019-07-16 ENCOUNTER — Ambulatory Visit: Payer: BLUE CROSS/BLUE SHIELD | Attending: Nurse Practitioner | Admitting: Nurse Practitioner

## 2019-07-16 ENCOUNTER — Other Ambulatory Visit: Payer: Self-pay

## 2019-09-03 MED FILL — KETOCONAZOLE 2% SHAMPOO: 2 | 15 days supply | Qty: 120 | Fill #2

## 2019-09-18 ENCOUNTER — Ambulatory Visit: Payer: BLUE CROSS/BLUE SHIELD | Attending: Nurse Practitioner | Admitting: Nurse Practitioner

## 2019-09-18 ENCOUNTER — Other Ambulatory Visit: Payer: Self-pay

## 2019-11-08 IMAGING — CR DG CHEST 2V
2 series · 2 of 2 positions shown · non-contrast
Comparison: November 29, 2014

CLINICAL DATA: Fever

EXAM:
CHEST - 2 VIEW

[chest lat]
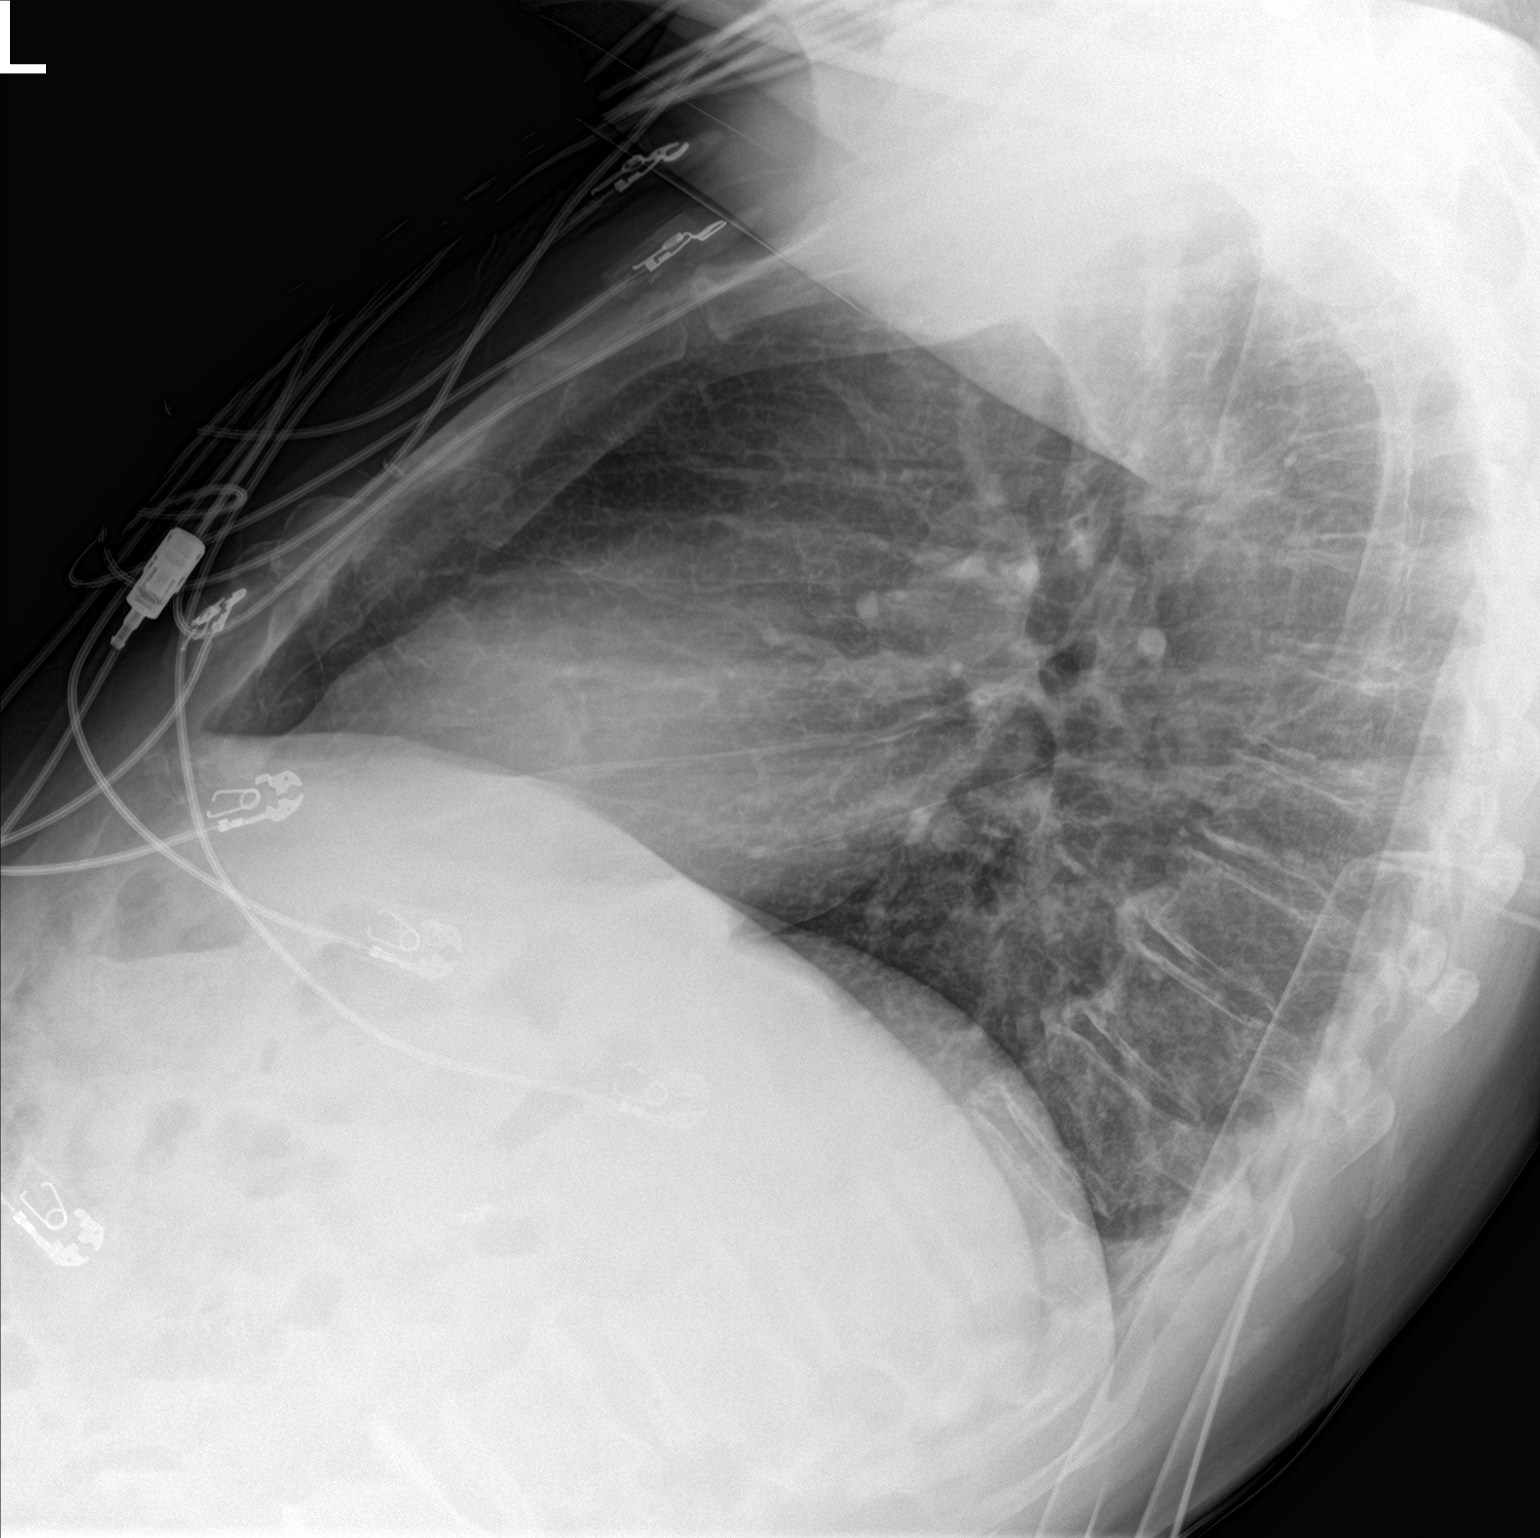

[chest ap]
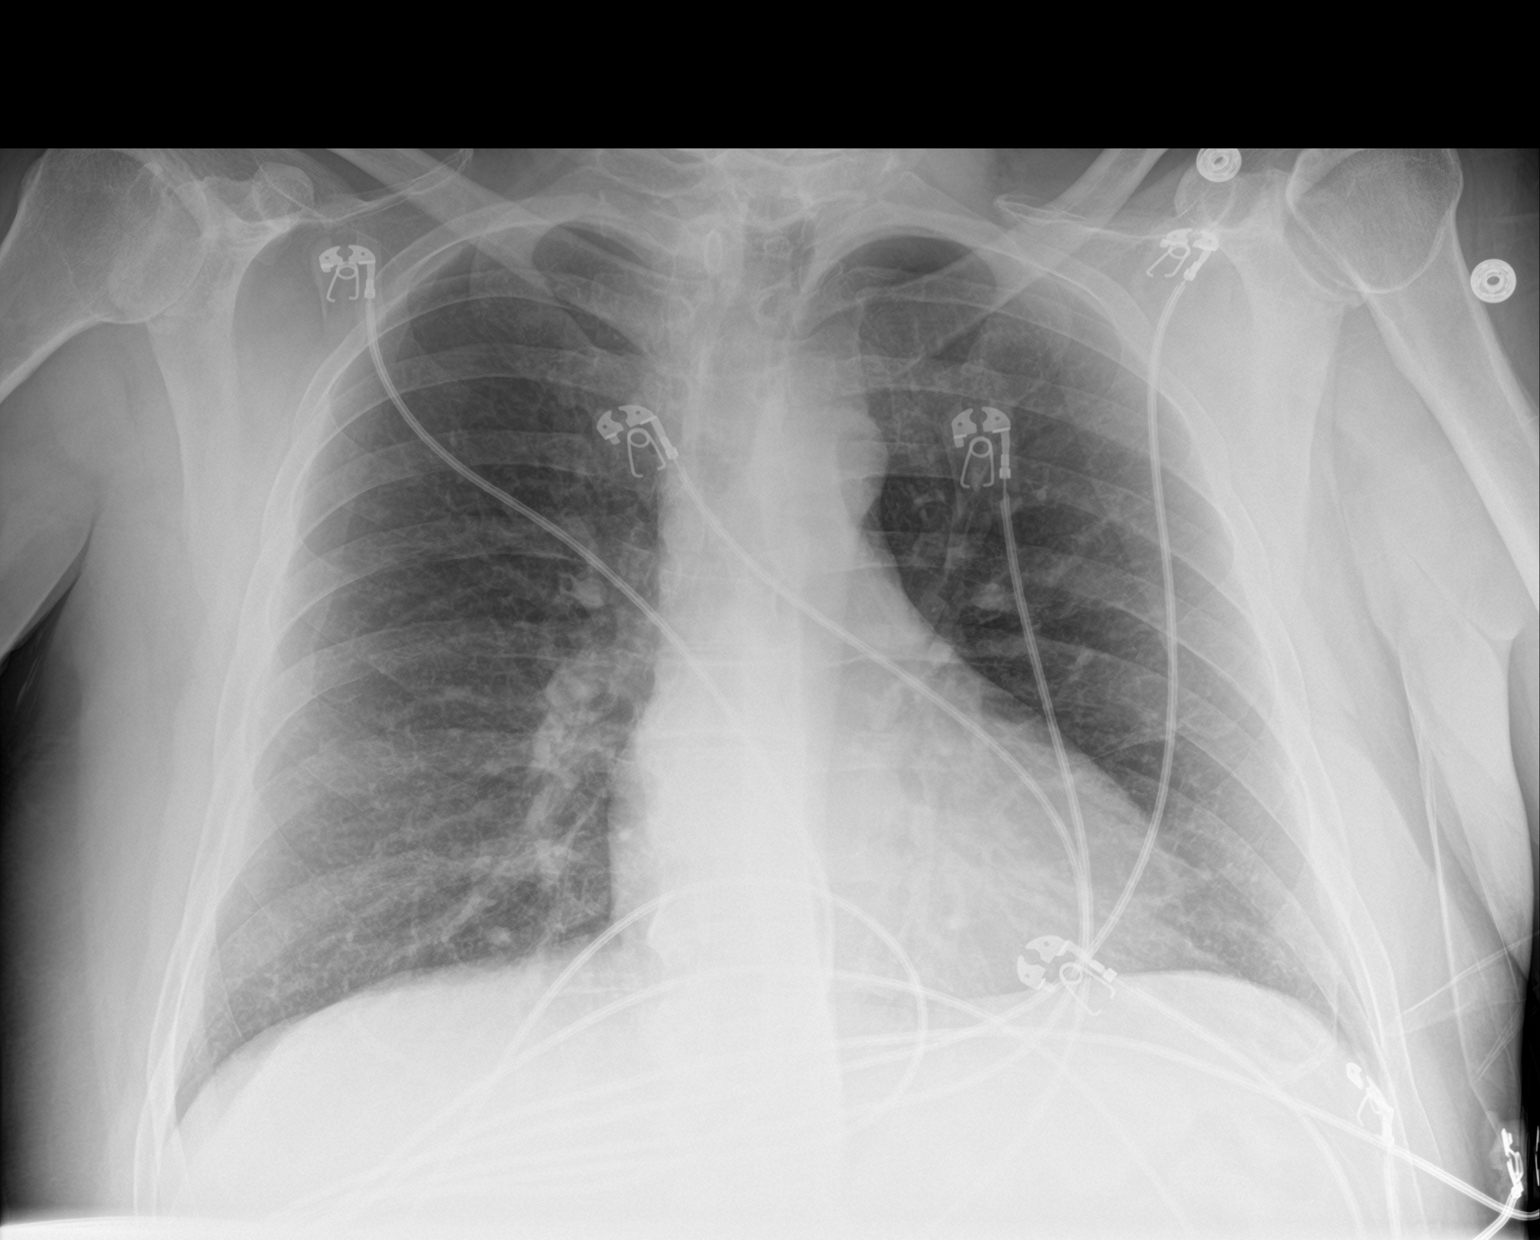

[2 of 2 positions shown; findings below may reference images not displayed]

FINDINGS: The lungs are clear. The heart size and pulmonary vascularity are
normal. No adenopathy. There is degenerative change in the thoracic
spine.
IMPRESSION: No edema or consolidation.

## 2019-11-20 MED FILL — DOXYCYCLINE HYC 50 MG CAP: 50 | 30 days supply | Qty: 60 | Fill #2

## 2019-11-21 MED FILL — KETOCONAZOLE 2% SHAMPOO: 2 | 30 days supply | Qty: 120 | Fill #0

## 2020-02-07 ENCOUNTER — Other Ambulatory Visit: Payer: Self-pay | Admitting: Nurse Practitioner

## 2020-02-07 DIAGNOSIS — L739 Follicular disorder, unspecified: Secondary | ICD-10-CM

## 2020-02-18 ENCOUNTER — Other Ambulatory Visit: Payer: Self-pay

## 2020-02-18 ENCOUNTER — Ambulatory Visit: Payer: Self-pay | Admitting: Nurse Practitioner

## 2020-03-26 ENCOUNTER — Encounter: Payer: Self-pay | Admitting: Nurse Practitioner

## 2020-03-26 ENCOUNTER — Ambulatory Visit: Payer: 59 | Attending: Nurse Practitioner | Admitting: Nurse Practitioner

## 2020-03-26 ENCOUNTER — Other Ambulatory Visit: Payer: Self-pay

## 2020-03-26 VITALS — BP 135/75 | HR 54 | Temp 97.7°F | Wt 175.0 lb

## 2020-03-26 DIAGNOSIS — Z13220 Encounter for screening for lipoid disorders: Secondary | ICD-10-CM

## 2020-03-26 DIAGNOSIS — Z1159 Encounter for screening for other viral diseases: Secondary | ICD-10-CM

## 2020-03-26 DIAGNOSIS — R7303 Prediabetes: Secondary | ICD-10-CM

## 2020-03-26 DIAGNOSIS — Z0000 Encounter for general adult medical examination without abnormal findings: Secondary | ICD-10-CM | POA: Diagnosis not present

## 2020-03-26 DIAGNOSIS — H6123 Impacted cerumen, bilateral: Secondary | ICD-10-CM

## 2020-03-26 DIAGNOSIS — Z114 Encounter for screening for human immunodeficiency virus [HIV]: Secondary | ICD-10-CM

## 2020-03-26 DIAGNOSIS — J449 Chronic obstructive pulmonary disease, unspecified: Secondary | ICD-10-CM

## 2020-03-26 LAB — POCT GLYCOSYLATED HEMOGLOBIN (HGB A1C): Hemoglobin A1C: 5.9 % — AB (ref 4.0–5.6)

## 2020-03-26 LAB — GLUCOSE, POCT (MANUAL RESULT ENTRY): POC Glucose: 121 mg/dl — AB (ref 70–99)

## 2020-03-26 MED ORDER — ALBUTEROL SULFATE HFA 108 (90 BASE) MCG/ACT IN AERS: 2.0000 | INHALATION_SPRAY | Freq: Four times a day (QID) | RESPIRATORY_TRACT | 1 refills | Status: DC | PRN

## 2020-03-26 MED FILL — ALBUTEROL SULFATE HFA 108 (: 108 (90 BAS | 25 days supply | Qty: 18 | Fill #0

## 2020-03-26 NOTE — Progress Notes (Signed)
Assessment & Plan:  Mathew Bridges was seen today for annual exam.  Diagnoses and all orders for this visit:  Encounter for annual physical exam  Bilateral impacted cerumen  Prediabetes -     Glucose (CBG) -     HgB A1c -     CMP14+EGFR  Need for hepatitis C screening test -     Hepatitis C Antibody  Encounter for screening for HIV -     HIV antibody (with reflex)  COPD without exacerbation (HCC) -     CBC -     albuterol (VENTOLIN HFA) 108 (90 Base) MCG/ACT inhaler; Inhale 2 puffs into the lungs every 6 (six) hours as needed for wheezing or shortness of breath. NEEDS PASS OR DOH  Lipid screening -     Lipid panel    Patient has been counseled on age-appropriate routine health concerns for screening and prevention. These are reviewed and up-to-date. Referrals have been placed accordingly. Immunizations are up-to-date or declined.    Subjective:   Chief Complaint  Patient presents with  . Annual Exam    Pt. is here for a physical exam.    HPI Mathew Bridges 59 y.o. male presents to office today for physical exam.   Review of Systems  Constitutional: Negative for fever, malaise/fatigue and weight loss.  HENT: Negative.  Negative for nosebleeds.   Eyes: Negative.  Negative for blurred vision, double vision and photophobia.  Respiratory: Negative.  Negative for cough and shortness of breath.   Cardiovascular: Negative.  Negative for chest pain, palpitations and leg swelling.  Gastrointestinal: Negative.  Negative for heartburn, nausea and vomiting.  Genitourinary: Negative.   Musculoskeletal: Negative.  Negative for myalgias.  Skin: Negative.   Neurological: Negative.  Negative for dizziness, focal weakness, seizures and headaches.  Endo/Heme/Allergies: Negative.   Psychiatric/Behavioral: Negative.  Negative for suicidal ideas.    Past Medical History:  Diagnosis Date  . Asthma   . COPD (chronic obstructive pulmonary disease) ([place])   . Folliculitis   . Prediabetes     . Shortness of breath dyspnea    with exertion    Past Surgical History:  Procedure Laterality Date  . CHOLECYSTECTOMY    . INSERTION OF MESH N/A 03/22/2016   Procedure: INSERTION OF MESH;  Surgeon: Ralene Ok, MD;  Location: [facility];  Service: General;  Laterality: N/A;  . VENTRAL HERNIA REPAIR N/A 03/22/2016   Procedure: LAPAROSCOPIC VENTRAL HERNIA;  Surgeon: Ralene Ok, MD;  Location: [facility] OR;  Service: General;  Laterality: N/A;    Family History  Problem Relation Age of Onset  . COPD Paternal Grandfather   . Healthy Mother   . Healthy Father     Social History Reviewed with no changes to be made today.   Outpatient Medications Prior to Visit  Medication Sig Dispense Refill  . mupirocin ointment (BACTROBAN) 2 % Apply topically to scalp 2 times per day 22 g 0  . albuterol (VENTOLIN HFA) 108 (90 Base) MCG/ACT inhaler Inhale 2 puffs into the lungs every 6 (six) hours as needed for wheezing or shortness of breath. 1 Inhaler 0  . chlorhexidine (HIBICLENS) 4 % external liquid Apply topically daily as needed. Dilute 10-15 mL in water, Use daily when bathing for 1-2 weeks 120 mL 0  . metroNIDAZOLE (METROGEL) 1 % gel Apply topically to face daily 60 g 0   No facility-administered medications prior to visit.    No Known Allergies     Objective:    BP  135/75 (BP Location: Left Arm, Patient Position: Sitting, Cuff Size: Normal)   Pulse (!) 54   Temp 97.7 F (36.5 C) (Temporal)   Wt 175 lb (79.4 kg)   SpO2 97%   BMI 28.25 kg/m  Wt Readings from Last 3 Encounters:  03/26/20 175 lb (79.4 kg)  02/20/19 169 lb (76.7 kg)  09/06/18 184 lb (83.5 kg)    Physical Exam Constitutional:      Appearance: He is well-developed.  HENT:     Head: Normocephalic and atraumatic.     Right Ear: Hearing and external ear normal. There is impacted cerumen.     Left Ear: Hearing and external ear normal. There is impacted cerumen.     Ears:     Comments: Unsuccessful ear irrigation  after multiple attempts to remove copious cerumen from bilateral ears    Nose: Nose normal. No mucosal edema or rhinorrhea.     Mouth/Throat:     Pharynx: Uvula midline.     Tonsils: No tonsillar exudate. 1+ on the right. 1+ on the left.  Eyes:     General: Lids are normal. No scleral icterus.    Extraocular Movements:     Right eye: Nystagmus present.     Left eye: Nystagmus present.     Conjunctiva/sclera: Conjunctivae normal.     Pupils: Pupils are equal, round, and reactive to light.  Neck:     Thyroid: No thyromegaly.     Trachea: No tracheal deviation.  Cardiovascular:     Rate and Rhythm: Normal rate and regular rhythm.     Heart sounds: Normal heart sounds. No murmur heard.  No friction rub. No gallop.   Pulmonary:     Effort: Pulmonary effort is normal. No respiratory distress.     Breath sounds: Normal breath sounds. No wheezing or rales.  Chest:     Chest wall: No mass or tenderness.     Breasts:        Right: No inverted nipple, mass, nipple discharge, skin change or tenderness.        Left: No inverted nipple, mass, nipple discharge, skin change or tenderness.  Abdominal:     General: Bowel sounds are normal. There is no distension.     Palpations: Abdomen is soft. There is no mass.     Tenderness: There is no abdominal tenderness. There is no guarding or rebound.     Hernia: There is no hernia in the left inguinal area.  Genitourinary:    Penis: Normal.      Testes: Normal.        Right: Mass, tenderness or swelling not present. Right testis is descended. Cremasteric reflex is present.         Left: Mass, tenderness or swelling not present. Left testis is descended. Cremasteric reflex is present.   Musculoskeletal:        General: No tenderness or deformity. Normal range of motion.     Cervical back: Normal range of motion and neck supple.  Lymphadenopathy:     Cervical: No cervical adenopathy.     Lower Body: No right inguinal adenopathy. No left inguinal  adenopathy.  Skin:    General: Skin is warm and dry.     Capillary Refill: Capillary refill takes less than 2 seconds.     Findings: No erythema.     Comments: Numerous tattoos on arms and abdomen  Neurological:     Mental Status: He is alert and oriented to person, place, and time.  Cranial Nerves: Cranial nerves are intact. No cranial nerve deficit.     Sensory: Sensation is intact.     Motor: Motor function is intact. No abnormal muscle tone.     Coordination: Coordination is intact. Coordination normal.     Gait: Gait is intact.     Deep Tendon Reflexes: Reflexes normal.     Reflex Scores:      Patellar reflexes are 1+ on the right side and 1+ on the left side. Psychiatric:        Attention and Perception: Attention and perception normal.        Mood and Affect: Mood and affect normal.        Speech: Speech normal.        Behavior: Behavior normal.        Thought Content: Thought content normal.        Judgment: Judgment normal.          Patient has been counseled extensively about nutrition and exercise as well as the importance of adherence with medications and regular follow-up. The patient was given clear instructions to go to ER or return to medical center if symptoms don't improve, worsen or new problems develop. The patient verbalized understanding.   Follow-up: Return 1 week for Ear errigation both ear Nurse visit.   Gildardo Pounds, FNP-BC [facility] and [place], [place]   03/26/2020, 12:17 PM

## 2020-03-27 LAB — CMP14+EGFR
ALT: 40 IU/L (ref 0–44)
AST: 27 IU/L (ref 0–40)
Albumin/Globulin Ratio: 1.5 (ref 1.2–2.2)
Albumin: 4.6 g/dL (ref 3.8–4.9)
Alkaline Phosphatase: 124 IU/L — ABNORMAL HIGH (ref 48–121)
BUN/Creatinine Ratio: 13 (ref 9–20)
BUN: 11 mg/dL (ref 6–24)
Bilirubin Total: 1.3 mg/dL — ABNORMAL HIGH (ref 0.0–1.2)
CO2: 24 mmol/L (ref 20–29)
Calcium: 9.4 mg/dL (ref 8.7–10.2)
Chloride: 100 mmol/L (ref 96–106)
Creatinine, Ser: 0.83 mg/dL (ref 0.76–1.27)
GFR calc Af Amer: 111 mL/min/{1.73_m2} (ref >59)
GFR calc non Af Amer: 96 mL/min/{1.73_m2} (ref >59)
Globulin, Total: 3 g/dL (ref 1.5–4.5)
Glucose: 99 mg/dL (ref 65–99)
Potassium: 4.2 mmol/L (ref 3.5–5.2)
Sodium: 139 mmol/L (ref 134–144)
Total Protein: 7.6 g/dL (ref 6.0–8.5)

## 2020-03-27 LAB — CBC
Hematocrit: 47.9 % (ref 37.5–51.0)
Hemoglobin: 16.6 g/dL (ref 13.0–17.7)
MCH: 32.5 pg (ref 26.6–33.0)
MCHC: 34.7 g/dL (ref 31.5–35.7)
MCV: 94 fL (ref 79–97)
Platelets: 112 10*3/uL — ABNORMAL LOW (ref 150–450)
RBC: 5.1 x10E6/uL (ref 4.14–5.80)
RDW: 13.1 % (ref 11.6–15.4)
WBC: 5.8 10*3/uL (ref 3.4–10.8)

## 2020-03-27 LAB — HEPATITIS C ANTIBODY: Hep C Virus Ab: <0.1 s/co ratio (ref 0.0–0.9)

## 2020-03-27 LAB — LIPID PANEL
Chol/HDL Ratio: 5.6 ratio — ABNORMAL HIGH (ref 0.0–5.0)
Cholesterol, Total: 195 mg/dL (ref 100–199)
HDL: 35 mg/dL — ABNORMAL LOW (ref >39)
LDL Chol Calc (NIH): 107 mg/dL — ABNORMAL HIGH (ref 0–99)
Triglycerides: 306 mg/dL — ABNORMAL HIGH (ref 0–149)
VLDL Cholesterol Cal: 53 mg/dL — ABNORMAL HIGH (ref 5–40)

## 2020-03-27 LAB — HIV ANTIBODY (ROUTINE TESTING W REFLEX): HIV Screen 4th Generation wRfx: NONREACTIVE

## 2020-04-02 ENCOUNTER — Other Ambulatory Visit: Payer: Self-pay | Admitting: Nurse Practitioner

## 2020-04-02 ENCOUNTER — Ambulatory Visit: Payer: 59 | Attending: Nurse Practitioner

## 2020-04-02 ENCOUNTER — Other Ambulatory Visit: Payer: Self-pay

## 2020-04-02 DIAGNOSIS — H6123 Impacted cerumen, bilateral: Secondary | ICD-10-CM

## 2020-04-02 DIAGNOSIS — D696 Thrombocytopenia, unspecified: Secondary | ICD-10-CM

## 2020-04-02 MED ORDER — ATORVASTATIN CALCIUM 40 MG PO TABS: 40.0000 mg | ORAL_TABLET | Freq: Every day | ORAL | 3 refills | Status: DC

## 2020-04-02 MED FILL — ATORVASTATIN CALCIUM 40 MG: 40 | 30 days supply | Qty: 30 | Fill #0

## 2020-04-02 NOTE — Progress Notes (Signed)
Pt. Came in and got his ears flushed. Pt. Left ear was flush and the eardrum is visible.  Pt.'s right ear still have cerumen. Advised patient to put debrox in his ear to loosen up the ear wax and will come back to get his right ear flush out again.   Pt. Tolerated well.

## 2020-04-16 MED FILL — ATORVASTATIN CALCIUM 40 MG: 40 | 30 days supply | Qty: 30 | Fill #0

## 2020-05-01 ENCOUNTER — Ambulatory Visit: Payer: 59

## 2020-05-07 ENCOUNTER — Ambulatory Visit: Payer: 59

## 2020-05-13 ENCOUNTER — Ambulatory Visit: Payer: 59

## 2020-05-14 ENCOUNTER — Ambulatory Visit: Payer: 59 | Attending: Nurse Practitioner

## 2020-05-14 ENCOUNTER — Other Ambulatory Visit: Payer: Self-pay

## 2020-05-21 ENCOUNTER — Other Ambulatory Visit: Payer: Self-pay

## 2020-05-21 ENCOUNTER — Ambulatory Visit: Payer: 59 | Attending: Nurse Practitioner

## 2020-05-21 DIAGNOSIS — D696 Thrombocytopenia, unspecified: Secondary | ICD-10-CM | POA: Diagnosis not present

## 2020-05-21 NOTE — Progress Notes (Signed)
Attempted to irrigate patient right ear. Dropped 3-4 drops of debrox and flushed the ear w/ peroxide solution mix w/ room temp. Water. Pt. Tolerated well.  CMA was not able to get the ear wax out fully, right ear still have wax impacted around the ear drum.  CMA did inform patient that will inform PCP to place a ENT referral.

## 2020-05-22 ENCOUNTER — Other Ambulatory Visit: Payer: Self-pay | Admitting: Nurse Practitioner

## 2020-05-22 DIAGNOSIS — H6123 Impacted cerumen, bilateral: Secondary | ICD-10-CM

## 2020-05-22 DIAGNOSIS — D696 Thrombocytopenia, unspecified: Secondary | ICD-10-CM

## 2020-05-22 LAB — CBC WITH DIFFERENTIAL/PLATELET
Basophils Absolute: 0 10*3/uL (ref 0.0–0.2)
Basos: 1 %
EOS (ABSOLUTE): 0.2 10*3/uL (ref 0.0–0.4)
Eos: 3 %
Hematocrit: 44.6 % (ref 37.5–51.0)
Hemoglobin: 15.3 g/dL (ref 13.0–17.7)
Immature Grans (Abs): 0 10*3/uL (ref 0.0–0.1)
Immature Granulocytes: 0 %
Lymphocytes Absolute: 2 10*3/uL (ref 0.7–3.1)
Lymphs: 30 %
MCH: 32.8 pg (ref 26.6–33.0)
MCHC: 34.3 g/dL (ref 31.5–35.7)
MCV: 96 fL (ref 79–97)
Monocytes Absolute: 0.5 10*3/uL (ref 0.1–0.9)
Monocytes: 7 %
Neutrophils Absolute: 3.9 10*3/uL (ref 1.4–7.0)
Neutrophils: 59 %
Platelets: 102 10*3/uL — ABNORMAL LOW (ref 150–450)
RBC: 4.67 x10E6/uL (ref 4.14–5.80)
RDW: 12.9 % (ref 11.6–15.4)
WBC: 6.6 10*3/uL (ref 3.4–10.8)

## 2020-07-26 ENCOUNTER — Ambulatory Visit: Payer: 59

## 2020-12-03 ENCOUNTER — Ambulatory Visit: Payer: 59 | Admitting: Nurse Practitioner

## 2021-03-25 ENCOUNTER — Telehealth: Payer: Self-pay | Admitting: Nurse Practitioner

## 2021-03-25 ENCOUNTER — Ambulatory Visit: Payer: 59 | Attending: Nurse Practitioner | Admitting: Nurse Practitioner

## 2021-03-25 NOTE — Telephone Encounter (Signed)
No answer. LVM to return call to office 

## 2021-04-28 ENCOUNTER — Emergency Department (HOSPITAL_BASED_OUTPATIENT_CLINIC_OR_DEPARTMENT_OTHER): Payer: 59

## 2021-04-28 ENCOUNTER — Emergency Department (HOSPITAL_COMMUNITY)
Admission: EM | Admit: 2021-04-28 | Discharge: 2021-04-28 | Disposition: A | Discharge status: Home / Self Care | Payer: 59 | Attending: Emergency Medicine | Admitting: Emergency Medicine

## 2021-04-28 ENCOUNTER — Encounter (HOSPITAL_COMMUNITY): Payer: Self-pay | Admitting: Emergency Medicine

## 2021-04-28 ENCOUNTER — Emergency Department (HOSPITAL_COMMUNITY): Payer: 59

## 2021-04-28 ENCOUNTER — Other Ambulatory Visit: Payer: Self-pay

## 2021-04-28 DIAGNOSIS — M7989 Other specified soft tissue disorders: Secondary | ICD-10-CM

## 2021-04-28 DIAGNOSIS — J449 Chronic obstructive pulmonary disease, unspecified: Secondary | ICD-10-CM | POA: Diagnosis not present

## 2021-04-28 DIAGNOSIS — J45909 Unspecified asthma, uncomplicated: Secondary | ICD-10-CM | POA: Diagnosis not present

## 2021-04-28 DIAGNOSIS — L03116 Cellulitis of left lower limb: Secondary | ICD-10-CM | POA: Insufficient documentation

## 2021-04-28 DIAGNOSIS — M25572 Pain in left ankle and joints of left foot: Secondary | ICD-10-CM

## 2021-04-28 DIAGNOSIS — F17210 Nicotine dependence, cigarettes, uncomplicated: Secondary | ICD-10-CM | POA: Diagnosis not present

## 2021-04-28 DIAGNOSIS — M79672 Pain in left foot: Secondary | ICD-10-CM | POA: Diagnosis present

## 2021-04-28 LAB — I-STAT CHEM 8, ED
BUN: 7 mg/dL (ref 6–20)
Calcium, Ion: 1.16 mmol/L (ref 1.15–1.40)
Chloride: 103 mmol/L (ref 98–111)
Creatinine, Ser: 0.7 mg/dL (ref 0.61–1.24)
Glucose, Bld: 95 mg/dL (ref 70–99)
HCT: 46 % (ref 39.0–52.0)
Hemoglobin: 15.6 g/dL (ref 13.0–17.0)
Potassium: 4.3 mmol/L (ref 3.5–5.1)
Sodium: 141 mmol/L (ref 135–145)
TCO2: 30 mmol/L (ref 22–32)

## 2021-04-28 MED ORDER — IBUPROFEN 600 MG PO TABS: 600.0000 mg | ORAL_TABLET | Freq: Four times a day (QID) | ORAL | 0 refills | Status: DC | PRN

## 2021-04-28 MED ORDER — CEPHALEXIN 500 MG PO CAPS: 500.0000 mg | ORAL_CAPSULE | Freq: Four times a day (QID) | ORAL | 0 refills | Status: DC

## 2021-04-28 MED ORDER — ACETAMINOPHEN 500 MG PO TABS
1000.0000 mg | ORAL_TABLET | Freq: Once | ORAL | Status: AC
  Administered 2021-04-28: 1000 mg via ORAL
  Filled 2021-04-28: qty 2

## 2021-04-28 NOTE — ED Triage Notes (Signed)
Pt  here from home with c/o left foot pain times 2 weeks , no trauma noted ,

## 2021-04-28 NOTE — Progress Notes (Signed)
Lower extremity venous LT study completed.  Preliminary results relayed to [place], [place].  See CV Proc for preliminary results report.   Jean Rosenthal, RDMS, RVT

## 2021-04-28 NOTE — ED Provider Notes (Signed)
[facility] EMERGENCY DEPARTMENT Provider Note   CSN: [id number] Arrival date & time: 04/28/21  9509     History No chief complaint on file.   Mathew Bridges is a 60 y.o. male.  The history is provided by the patient. No language interpreter was used.    60 year old male with history of COPD, asthma who presents complaining of left foot pain.  For the past 2 weeks patient endorsed having pain to his left foot and ankle with associated swelling and redness.  He denies having any specific injury to it.  Pain is throbbing sensation, moderate in severity more noticeable after he gets off from work and not so much in the morning.  Denies any associated numbness no pain to his calf or knee or back no fever or chills.  No prior history of PE or DVT no recent surgery or prolonged bedrest.  No history of gout.  He does report some relief with taking Motrin.  No chest pain or shortness of breath.    Past Medical History:  Diagnosis Date   Asthma    COPD (chronic obstructive pulmonary disease) (HCC)    Folliculitis    Prediabetes    Shortness of breath dyspnea    with exertion    Patient Active Problem List   Diagnosis Date Noted   Skin lesion 02/20/2019   History of bronchitis 12/10/2014   Excess ear wax 12/10/2014    Past Surgical History:  Procedure Laterality Date   CHOLECYSTECTOMY     INSERTION OF MESH N/A 03/22/2016   Procedure: INSERTION OF MESH;  Surgeon: Axel Filler, MD;  Location: [facility] OR;  Service: General;  Laterality: N/A;   VENTRAL HERNIA REPAIR N/A 03/22/2016   Procedure: LAPAROSCOPIC VENTRAL HERNIA;  Surgeon: Axel Filler, MD;  Location: [facility] OR;  Service: General;  Laterality: N/A;       Family History  Problem Relation Age of Onset   COPD Paternal Grandfather    Healthy Mother    Healthy Father     Social History   Tobacco Use   Smoking status: Every Day    Packs/day: 0.50    Years: 30.00    Pack years: 15.00    Types: Cigarettes    Smokeless tobacco: Never  Vaping Use   Vaping Use: Never used  Substance Use Topics   Alcohol use: Yes    Comment: occassionally    Drug use: No    Home Medications Prior to Admission medications   Medication Sig Start Date End Date Taking? Authorizing Provider  ibuprofen (ADVIL) 200 MG tablet Take 200 mg by mouth every 6 (six) hours as needed for headache or moderate pain.   Yes [provider]  Multiple Vitamins-Minerals (CENTRUM MEN PO) Take 1 tablet by mouth daily.   Yes [provider]  albuterol (VENTOLIN HFA) 108 (90 Base) MCG/ACT inhaler Inhale 2 puffs into the lungs every 6 (six) hours as needed for wheezing or shortness of breath. NEEDS PASS OR DOH Patient not taking: No sig reported 03/26/20   Claiborne Rigg, NP  atorvastatin (LIPITOR) 40 MG tablet Take 1 tablet (40 mg total) by mouth daily. Patient not taking: No sig reported 04/02/20   Claiborne Rigg, NP  mupirocin ointment (BACTROBAN) 2 % Apply topically to scalp 2 times per day Patient not taking: No sig reported 02/20/19   Claiborne Rigg, NP    Allergies    Patient has no known allergies.  Review of  Systems   Review of Systems  Constitutional:  Negative for fever.  Skin:  Negative for wound.  Neurological:  Negative for numbness.   Physical Exam Updated Vital Signs BP (!) 144/78   Pulse (!) 56   Temp 98.4 F (36.9 C) (Oral)   Resp 14   Ht 5\' 7"  (1.702 m)   Wt 88.5 kg   SpO2 98%   BMI 30.54 kg/m   Physical Exam Vitals and nursing note reviewed.  Constitutional:      General: He is not in acute distress.    Appearance: He is well-developed.  HENT:     Head: Atraumatic.  Eyes:     Conjunctiva/sclera: Conjunctivae normal.  Cardiovascular:     Rate and Rhythm: Normal rate and regular rhythm.     Pulses: Normal pulses.     Heart sounds: Normal heart sounds.  Pulmonary:     Effort: Pulmonary effort is normal.     Breath sounds: Normal breath sounds.  Musculoskeletal:         General: Swelling (Left lower extremity: Edema erythema and warmth noted to the dorsum of left foot and ankle diffusely with tenderness to palpation.  Intact dorsalis pedis pulse, no tenderness along the plantar fascia or the heel.  No calf tenderness.) present.     Cervical back: Neck supple.  Skin:    Findings: No rash.  Neurological:     Mental Status: He is alert.    ED Results / Procedures / Treatments   Labs (all labs ordered are listed, but only abnormal results are displayed) Labs Reviewed  I-STAT CHEM 8, ED    EKG None  Radiology DG Foot Complete Left  Result Date: 04/28/2021 CLINICAL DATA:  60 year old male with history of pain in the lateral aspect of the left foot for the past 2 weeks. EXAM: LEFT FOOT - COMPLETE 3+ VIEW COMPARISON:  No priors. FINDINGS: There is no evidence of fracture or dislocation. Mild multifocal degenerative changes of osteoarthritis are noted throughout the midfoot and forefoot. Extensive ossification of the plantar fascia. IMPRESSION: 1. No acute radiographic abnormality of the left foot. 2. Extensive ossification of the plantar fascia, likely sequela of chronic plantar fasciitis. 3. Midfoot and forefoot degenerative changes compatible with osteoarthritis. Electronically Signed   By: 66 M.D.   On: 04/28/2021 07:58   VAS 06/28/2021 LOWER EXTREMITY VENOUS (DVT) ([facility] 7a-7p)  Result Date: 04/28/2021  Lower Venous DVT Study Patient Name:  Mathew Bridges  Date of Exam:   04/28/2021 Medical Rec #: 06/28/2021       Accession #:    [id number] Date of Birth: 1960-12-08       Patient Gender: M Patient Age:   59Y Exam Location:  [facility] Procedure:      VAS [facility] LOWER EXTREMITY VENOUS (DVT) Referring Phys: [id number] Hendryx Ricke --------------------------------------------------------------------------------  Indications: Pain and swelling LT foot/ankle.  Comparison Study: No prior studies. Performing Technologist: [place] RDMS,RVT  Examination  Guidelines: A complete evaluation includes B-mode imaging, spectral Doppler, color Doppler, and power Doppler as needed of all accessible portions of each vessel. Bilateral testing is considered an integral part of a complete examination. Limited examinations for reoccurring indications may be performed as noted. The reflux portion of the exam is performed with the patient in reverse Trendelenburg.  +-----+---------------+---------+-----------+----------+--------------+ RIGHTCompressibilityPhasicitySpontaneityPropertiesThrombus Aging +-----+---------------+---------+-----------+----------+--------------+ CFV  Full           Yes      Yes                                 +-----+---------------+---------+-----------+----------+--------------+   +---------+---------------+---------+-----------+----------+--------------+  LEFT     CompressibilityPhasicitySpontaneityPropertiesThrombus Aging +---------+---------------+---------+-----------+----------+--------------+ CFV      Full           Yes      Yes                                 +---------+---------------+---------+-----------+----------+--------------+ SFJ      Full                                                        +---------+---------------+---------+-----------+----------+--------------+ FV Prox  Full                                                        +---------+---------------+---------+-----------+----------+--------------+ FV Mid   Full                                                        +---------+---------------+---------+-----------+----------+--------------+ FV DistalFull                                                        +---------+---------------+---------+-----------+----------+--------------+ PFV      Full                                                        +---------+---------------+---------+-----------+----------+--------------+ POP      Full           Yes      Yes                                  +---------+---------------+---------+-----------+----------+--------------+ PTV      Full                                                        +---------+---------------+---------+-----------+----------+--------------+ PERO     Full                                                        +---------+---------------+---------+-----------+----------+--------------+ Gastroc  Full                                                        +---------+---------------+---------+-----------+----------+--------------+  Summary: RIGHT: - No evidence of common femoral vein obstruction.  LEFT: - There is no evidence of deep vein thrombosis in the lower extremity.  - No cystic structure found in the popliteal fossa.  *See table(s) above for measurements and observations.    Preliminary     Procedures Procedures   Medications Ordered in ED Medications  acetaminophen (TYLENOL) tablet 1,000 mg (1,000 mg Oral Given 04/28/21 1213)    ED Course  I have reviewed the triage vital signs and the nursing notes.  Pertinent labs & imaging results that were available during my care of the patient were reviewed by me and considered in my medical decision making (see chart for details).    MDM Rules/Calculators/A&P                           BP (!) 160/71 (BP Location: Right Arm)   Pulse 68   Temp 98.4 F (36.9 C) (Oral)   Resp 16   Ht 5\' 7"  (1.702 m)   Wt 88.5 kg   SpO2 99%   BMI 30.54 kg/m   Final Clinical Impression(s) / ED Diagnoses Final diagnoses:  Cellulitis of left foot    Rx / DC Orders ED Discharge Orders          Ordered    cephALEXin (KEFLEX) 500 MG capsule  4 times daily        04/28/21 1259    ibuprofen (ADVIL) 600 MG tablet  Every 6 hours PRN        04/28/21 1259           11:31 AM Patient here with atraumatic left foot and ankle swelling redness and warmth for the past 2 weeks.Finding may suggest an underlying cellulitis however it  would be prudent to obtain a vascular ultrasound study to rule out DVT.  Doubt gout.  X-ray of the left foot without any acute finding.  Midfoot and forefoot degenerative change compatible with osteoarthritis which could potentially explain patient's symptom.  There is extensive ossifications of the plantar fascia, likely sequela of a chronic plantar fasciitis.  Patient's presenting complaint is not consistent with plantar fasciitis.  12:56 PM Labs are reassuring.  Fortunately vascular ultrasound of the left lower extremity without any evidence of DVT.  Patient discharged home with antibiotic to treat for cellulitis.  Return precaution given.    06/28/21, PA-C 04/28/21 1304    [place], 06/28/21, DO 04/28/21 1523

## 2021-04-28 NOTE — Discharge Instructions (Addendum)
Your left foot and ankle swelling and redness is likely due to a skin infection.  Fortunately no evidence of blood clot in your leg on ultrasound study.  X-ray did not show any broken bone or dislocation.  Take antibiotic as prescribed.  Take ibuprofen as needed for pain.  Follow-up with your doctor for further care.  Return if you have any concern.

## 2021-05-27 ENCOUNTER — Other Ambulatory Visit: Payer: Self-pay

## 2021-05-27 ENCOUNTER — Encounter: Payer: Self-pay | Admitting: Physician Assistant

## 2021-05-27 ENCOUNTER — Ambulatory Visit: Payer: 59 | Attending: Physician Assistant | Admitting: Physician Assistant

## 2021-05-27 VITALS — BP 145/78 | HR 58 | Ht 67.0 in | Wt 165.2 lb

## 2021-05-27 DIAGNOSIS — E785 Hyperlipidemia, unspecified: Secondary | ICD-10-CM

## 2021-05-27 DIAGNOSIS — L03119 Cellulitis of unspecified part of limb: Secondary | ICD-10-CM

## 2021-05-27 DIAGNOSIS — J449 Chronic obstructive pulmonary disease, unspecified: Secondary | ICD-10-CM

## 2021-05-27 DIAGNOSIS — L739 Follicular disorder, unspecified: Secondary | ICD-10-CM

## 2021-05-27 DIAGNOSIS — D696 Thrombocytopenia, unspecified: Secondary | ICD-10-CM

## 2021-05-27 DIAGNOSIS — Z09 Encounter for follow-up examination after completed treatment for conditions other than malignant neoplasm: Secondary | ICD-10-CM

## 2021-05-27 DIAGNOSIS — L02619 Cutaneous abscess of unspecified foot: Secondary | ICD-10-CM

## 2021-05-27 MED ORDER — ALBUTEROL SULFATE HFA 108 (90 BASE) MCG/ACT IN AERS
2.0000 | INHALATION_SPRAY | Freq: Four times a day (QID) | RESPIRATORY_TRACT | 1 refills | Status: DC | PRN
  Filled 2021-05-27: qty 18, 25d supply, fill #0

## 2021-05-27 MED ORDER — MUPIROCIN 2 % EX OINT
TOPICAL_OINTMENT | CUTANEOUS | 1 refills | Status: DC
  Filled 2021-05-27: qty 22, 30d supply, fill #0
  Filled 2021-07-15: qty 22, 30d supply, fill #1

## 2021-05-27 MED ORDER — ATORVASTATIN CALCIUM 40 MG PO TABS
40.0000 mg | ORAL_TABLET | Freq: Every day | ORAL | 3 refills | Status: DC
  Filled 2021-05-27: qty 90, 90d supply, fill #0

## 2021-05-27 NOTE — Progress Notes (Signed)
Patient ID: Mathew Bridges, male   DOB: 04/04/1961, 60 y.o.   MRN: [id number]     Mathew Bridges, is a 60 y.o. male  RCV:[id number]  ZWC:[id number]  DOB - April 05, 1961  Chief Complaint  Patient presents with   Hospitalization Follow-up       Subjective:   Mathew Bridges is a 60 y.o. male here today for a follow up visit After being seen at the ED for foot pain on 04/28/2021.  Chem 8 was normal.  He is doing well.  Finished cephalexin.  No fever.  Never got cholesterol meds filled last year.  Needs albuterol inhaler and mupiricin for occasional pimples in his scalp.    From ED A/P: Patient here with atraumatic left foot and ankle swelling redness and warmth for the past 2 weeks.Finding may suggest an underlying cellulitis however it would be prudent to obtain a vascular ultrasound study to rule out DVT.  Doubt gout.  X-ray of the left foot without any acute finding.  Midfoot and forefoot degenerative change compatible with osteoarthritis which could potentially explain patient's symptom.  There is extensive ossifications of the plantar fascia, likely sequela of a chronic plantar fasciitis.  Patient's presenting complaint is not consistent with plantar fasciitis.   12:56 PM Labs are reassuring.  Fortunately vascular ultrasound of the left lower extremity without any evidence of DVT.  Patient discharged home with antibiotic to treat for cellulitis.  Return precaution given. * No problems updated.  ALLERGIES: No Known Allergies  PAST MEDICAL HISTORY: Past Medical History:  Diagnosis Date   Asthma    COPD (chronic obstructive pulmonary disease) (HCC)    Folliculitis    Prediabetes    Shortness of breath dyspnea    with exertion    MEDICATIONS AT HOME: Prior to Admission medications   Medication Sig Start Date End Date Taking? Authorizing Provider  cephALEXin (KEFLEX) 500 MG capsule Take 1 capsule (500 mg total) by mouth 4 (four) times daily. 04/28/21  Yes Fayrene Helper, PA-C  ibuprofen  (ADVIL) 600 MG tablet Take 1 tablet (600 mg total) by mouth every 6 (six) hours as needed. 04/28/21  Yes Fayrene Helper, PA-C  Multiple Vitamins-Minerals (CENTRUM MEN PO) Take 1 tablet by mouth daily.   Yes [provider]  albuterol (VENTOLIN HFA) 108 (90 Base) MCG/ACT inhaler Inhale 2 puffs into the lungs every 6 (six) hours as needed for wheezing or shortness of breath. NEEDS PASS OR DOH 05/27/21   Georgian Co M, PA-C  atorvastatin (LIPITOR) 40 MG tablet Take 1 tablet (40 mg total) by mouth daily. 05/27/21   Anders Simmonds, PA-C  mupirocin ointment (BACTROBAN) 2 % Apply topically to scalp 2 times per day 05/27/21   Anders Simmonds, PA-C    Neg ROS otherwise  Objective:   Vitals:   05/27/21 1616  BP: (!) 145/78  Pulse: (!) 58  SpO2: 98%  Weight: 165 lb 4 oz (75 kg)  Height: 5\' 7"  (1.702 m)   Exam General appearance : Awake, alert, not in any distress. Speech Clear. Not toxic looking HEENT: Atraumatic and Normocephalic,Neck: Supple, no JVD. No cervical lymphadenopathy.  Chest: Good air entry bilaterally, CTAB.  No rales/rhonchi/wheezing CVS: S1 S2 regular, no murmurs.  Extremities: B/L Lower Ext shows no edema, both legs are warm to touch Neurology: Awake alert, and oriented X 3, CN II-XII intact, Non focal Skin: No Rash  Data Review Lab Results  Component Value Date   HGBA1C 5.9 (A) 03/26/2020   HGBA1C  5.8 (A) 02/20/2019   HGBA1C 5.8 (H) 12/10/2014    Assessment & Plan   1. Cellulitis and abscess of foot resolved  2. Encounter for examination following treatment at hospital Doing well  3. Thrombocytopenia (HCC) - CBC with Differential/Platelet  4. COPD without exacerbation (HCC) - albuterol (VENTOLIN HFA) 108 (90 Base) MCG/ACT inhaler; Inhale 2 puffs into the lungs every 6 (six) hours as needed for wheezing or shortness of breath. NEEDS PASS OR DOH  Dispense: 18 g; Refill: 1  5. Folliculitis - mupirocin ointment (BACTROBAN) 2 %; Apply topically to scalp  2 times per day  Dispense: 22 g; Refill: 1  6. Hyperlipidemia, unspecified hyperlipidemia type - atorvastatin (LIPITOR) 40 MG tablet; Take 1 tablet (40 mg total) by mouth daily.  Dispense: 90 tablet; Refill: 3 - Lipid panel    Patient have been counseled extensively about nutrition and exercise. Other issues discussed during this visit include: low cholesterol diet, weight control and daily exercise, foot care, annual eye examinations at Ophthalmology, importance of adherence with medications and regular follow-up. We also discussed long term complications of uncontrolled diabetes and hypertension.   Return in about 6 months (around 11/24/2021) for PCP;  chronic conditions.  The patient was given clear instructions to go to ER or return to medical center if symptoms don't improve, worsen or new problems develop. The patient verbalized understanding. The patient was told to call to get lab results if they haven't heard anything in the next week.      Georgian Co, PA-C [facility] [place], [place] [phone]   05/27/2021, 4:27 PM

## 2021-05-28 LAB — CBC WITH DIFFERENTIAL/PLATELET
Basophils Absolute: 0 10*3/uL (ref 0.0–0.2)
Basos: 1 %
EOS (ABSOLUTE): 0.2 10*3/uL (ref 0.0–0.4)
Eos: 3 %
Hematocrit: 44.4 % (ref 37.5–51.0)
Hemoglobin: 15.7 g/dL (ref 13.0–17.7)
Immature Grans (Abs): 0 10*3/uL (ref 0.0–0.1)
Immature Granulocytes: 1 %
Lymphocytes Absolute: 1.9 10*3/uL (ref 0.7–3.1)
Lymphs: 33 %
MCH: 32.4 pg (ref 26.6–33.0)
MCHC: 35.4 g/dL (ref 31.5–35.7)
MCV: 92 fL (ref 79–97)
Monocytes Absolute: 0.5 10*3/uL (ref 0.1–0.9)
Monocytes: 9 %
Neutrophils Absolute: 3 10*3/uL (ref 1.4–7.0)
Neutrophils: 53 %
Platelets: 120 10*3/uL — ABNORMAL LOW (ref 150–450)
RBC: 4.84 x10E6/uL (ref 4.14–5.80)
RDW: 13.3 % (ref 11.6–15.4)
WBC: 5.6 10*3/uL (ref 3.4–10.8)

## 2021-05-28 LAB — LIPID PANEL
Chol/HDL Ratio: 5.9 ratio — ABNORMAL HIGH (ref 0.0–5.0)
Cholesterol, Total: 178 mg/dL (ref 100–199)
HDL: 30 mg/dL — ABNORMAL LOW (ref >39)
LDL Chol Calc (NIH): 110 mg/dL — ABNORMAL HIGH (ref 0–99)
Triglycerides: 215 mg/dL — ABNORMAL HIGH (ref 0–149)
VLDL Cholesterol Cal: 38 mg/dL (ref 5–40)

## 2021-07-15 ENCOUNTER — Other Ambulatory Visit: Payer: Self-pay

## 2021-07-22 ENCOUNTER — Ambulatory Visit: Payer: 59 | Attending: Nurse Practitioner | Admitting: Nurse Practitioner

## 2021-07-22 ENCOUNTER — Encounter: Payer: Self-pay | Admitting: Nurse Practitioner

## 2021-07-22 ENCOUNTER — Other Ambulatory Visit: Payer: Self-pay

## 2021-07-22 VITALS — BP 138/82 | HR 56 | Ht 67.0 in | Wt 170.6 lb

## 2021-07-22 DIAGNOSIS — Z1211 Encounter for screening for malignant neoplasm of colon: Secondary | ICD-10-CM

## 2021-07-22 DIAGNOSIS — Z125 Encounter for screening for malignant neoplasm of prostate: Secondary | ICD-10-CM

## 2021-07-22 DIAGNOSIS — D696 Thrombocytopenia, unspecified: Secondary | ICD-10-CM | POA: Diagnosis not present

## 2021-07-22 DIAGNOSIS — R7303 Prediabetes: Secondary | ICD-10-CM

## 2021-07-22 DIAGNOSIS — L989 Disorder of the skin and subcutaneous tissue, unspecified: Secondary | ICD-10-CM | POA: Diagnosis not present

## 2021-07-22 NOTE — Progress Notes (Signed)
Daily you know you had it okay so I think is actually better to do that then stool test okay  Assessment & Plan:  Mamie was seen today for rash.  Diagnoses and all orders for this visit:  Thrombocytopenia (HCC) -     CBC with Differential -     Thyroid Panel With TSH  Skin lesion of back -     Ambulatory referral to Dermatology  Prostate cancer screening -     PSA  Colon cancer screening -     Ambulatory referral to Gastroenterology   Patient has been counseled on age-appropriate routine health concerns for screening and prevention. These are reviewed and up-to-date. Referrals have been placed accordingly. Immunizations are up-to-date or declined.    Subjective:   Chief Complaint  Patient presents with   Rash   HPI Mathew Bridges 60 y.o. male presents to office today for follow-up to his thrombocytopenia.  He denies any abnormal bleeding, bruises, fatigue or malaise today. He has a past medical history of Asthma, COPD, Folliculitis, Prediabetes,   In regard to his asthma his insurance would not cover albuterol inhaler.  He currently denies any cough, wheezing, shortness of breath.  He does continue to smoke less than a half a pack of cigarettes per day.  Prediabetes Currently well controlled without the use of any oral diabetic medications  DERM He has numerous hyperpigmented patchy macular lesions on his back that need evaluation for skin cancer screening.     Review of Systems  Constitutional:  Negative for fever, malaise/fatigue and weight loss.  HENT: Negative.  Negative for nosebleeds.   Eyes: Negative.  Negative for blurred vision, double vision and photophobia.  Respiratory: Negative.  Negative for cough and shortness of breath.   Cardiovascular: Negative.  Negative for chest pain, palpitations and leg swelling.  Gastrointestinal: Negative.  Negative for heartburn, nausea and vomiting.  Musculoskeletal: Negative.  Negative for myalgias.  Skin:        SEE HPI   Neurological: Negative.  Negative for dizziness, focal weakness, seizures and headaches.  Psychiatric/Behavioral: Negative.  Negative for suicidal ideas.    Past Medical History:  Diagnosis Date   Asthma    COPD (chronic obstructive pulmonary disease) (HCC)    Folliculitis    Prediabetes    Shortness of breath dyspnea    with exertion    Past Surgical History:  Procedure Laterality Date   CHOLECYSTECTOMY     INSERTION OF MESH N/A 03/22/2016   Procedure: INSERTION OF MESH;  Surgeon: Axel Filler, MD;  Location: [facility] OR;  Service: General;  Laterality: N/A;   VENTRAL HERNIA REPAIR N/A 03/22/2016   Procedure: LAPAROSCOPIC VENTRAL HERNIA;  Surgeon: Axel Filler, MD;  Location: [facility] OR;  Service: General;  Laterality: N/A;    Family History  Problem Relation Age of Onset   COPD Paternal Grandfather    Healthy Mother    Healthy Father     Social History Reviewed with no changes to be made today.   Outpatient Medications Prior to Visit  Medication Sig Dispense Refill   atorvastatin (LIPITOR) 40 MG tablet Take 1 tablet (40 mg total) by mouth daily. 90 tablet 3   Multiple Vitamins-Minerals (CENTRUM MEN PO) Take 1 tablet by mouth daily.     albuterol (VENTOLIN HFA) 108 (90 Base) MCG/ACT inhaler Inhale 2 puffs into the lungs every 6 (six) hours as needed for wheezing or shortness of breath. (Patient not taking: Reported on 07/22/2021) 18 g 1  mupirocin ointment (BACTROBAN) 2 % Apply topically to scalp 2 times per day (Patient not taking: Reported on 07/22/2021) 22 g 1   cephALEXin (KEFLEX) 500 MG capsule Take 1 capsule (500 mg total) by mouth 4 (four) times daily. (Patient not taking: Reported on 07/22/2021) 28 capsule 0   ibuprofen (ADVIL) 600 MG tablet Take 1 tablet (600 mg total) by mouth every 6 (six) hours as needed. (Patient not taking: Reported on 07/22/2021) 30 tablet 0   No facility-administered medications prior to visit.    No Known Allergies     Objective:    BP  138/82   Pulse (!) 56   Ht 5\' 7"  (1.702 m)   Wt 170 lb 9.6 oz (77.4 kg)   SpO2 98%   BMI 26.72 kg/m  Wt Readings from Last 3 Encounters:  07/22/21 170 lb 9.6 oz (77.4 kg)  05/27/21 165 lb 4 oz (75 kg)  04/28/21 195 lb (88.5 kg)    Physical Exam Vitals and nursing note reviewed.  Constitutional:      Appearance: He is well-developed.  HENT:     Head: Normocephalic and atraumatic.  Cardiovascular:     Rate and Rhythm: Regular rhythm. Bradycardia present.     Heart sounds: Normal heart sounds. No murmur heard.   No friction rub. No gallop.  Pulmonary:     Effort: Pulmonary effort is normal. No tachypnea or respiratory distress.     Breath sounds: Normal breath sounds. No decreased breath sounds, wheezing, rhonchi or rales.  Chest:     Chest wall: No tenderness.  Abdominal:     General: Bowel sounds are normal.     Palpations: Abdomen is soft.  Musculoskeletal:        General: Normal range of motion.     Cervical back: Normal range of motion.  Skin:    General: Skin is warm and dry.     Comments: See photos attached  Neurological:     Mental Status: He is alert and oriented to person, place, and time.     Coordination: Coordination normal.  Psychiatric:        Behavior: Behavior normal. Behavior is cooperative.        Thought Content: Thought content normal.        Judgment: Judgment normal.         Patient has been counseled extensively about nutrition and exercise as well as the importance of adherence with medications and regular follow-up. The patient was given clear instructions to go to ER or return to medical center if symptoms don't improve, worsen or new problems develop. The patient verbalized understanding.   Follow-up: Return in about 6 months (around 01/20/2022).   01/22/2022, FNP-BC [facility] [place], [place]   07/22/2021, 11:58 AM

## 2021-07-22 NOTE — Progress Notes (Signed)
Has bump on back.

## 2021-07-23 LAB — CBC WITH DIFFERENTIAL/PLATELET
Basophils Absolute: 0 10*3/uL (ref 0.0–0.2)
Basos: 0 %
EOS (ABSOLUTE): 0.2 10*3/uL (ref 0.0–0.4)
Eos: 3 %
Hematocrit: 46.9 % (ref 37.5–51.0)
Hemoglobin: 16.1 g/dL (ref 13.0–17.7)
Immature Grans (Abs): 0 10*3/uL (ref 0.0–0.1)
Immature Granulocytes: 1 %
Lymphocytes Absolute: 2.3 10*3/uL (ref 0.7–3.1)
Lymphs: 33 %
MCH: 32 pg (ref 26.6–33.0)
MCHC: 34.3 g/dL (ref 31.5–35.7)
MCV: 93 fL (ref 79–97)
Monocytes Absolute: 0.5 10*3/uL (ref 0.1–0.9)
Monocytes: 7 %
Neutrophils Absolute: 3.9 10*3/uL (ref 1.4–7.0)
Neutrophils: 56 %
Platelets: 117 10*3/uL — ABNORMAL LOW (ref 150–450)
RBC: 5.03 x10E6/uL (ref 4.14–5.80)
RDW: 13.8 % (ref 11.6–15.4)
WBC: 7 10*3/uL (ref 3.4–10.8)

## 2021-07-23 LAB — PSA: Prostate Specific Ag, Serum: 1.7 ng/mL (ref 0.0–4.0)

## 2021-07-23 LAB — THYROID PANEL WITH TSH
Free Thyroxine Index: 1.7 (ref 1.2–4.9)
T3 Uptake Ratio: 26 % (ref 24–39)
T4, Total: 6.5 ug/dL (ref 4.5–12.0)
TSH: 1.28 u[IU]/mL (ref 0.450–4.500)

## 2021-07-24 ENCOUNTER — Telehealth: Payer: Self-pay

## 2021-07-24 NOTE — Telephone Encounter (Signed)
-----   Message from Grayce Sessions, NP sent at 07/23/2021 11:39 AM EDT ----- Labs are essentially normal. There are some minor variations WBC is decline Thrombocytopenia this is not new but  will continue to monitor. Make sure you are drinking at least 48 oz of water per day. Work on eating a low fat, heart healthy diet and participate in regular aerobic exercise program to control as well. Exercise at least  30 minutes per day-5 days per week. Avoid red meat. No fried foods. No junk foods, sodas, sugary foods or drinks, unhealthy snacking, alcohol or smoking.    Any further question f/u with PCP  Marcelino Duster Np

## 2021-07-25 LAB — HEMOGLOBIN A1C
Est. average glucose Bld gHb Est-mCnc: 123 mg/dL
Hgb A1c MFr Bld: 5.9 % — ABNORMAL HIGH (ref 4.8–5.6)

## 2021-07-25 LAB — SPECIMEN STATUS REPORT

## 2021-07-27 ENCOUNTER — Ambulatory Visit: Payer: Self-pay | Admitting: *Deleted

## 2021-07-27 NOTE — Telephone Encounter (Addendum)
The patient has experienced cough and congestion for the past three days   The mucus has turned green in color and the patient would like to discuss their symptoms further   Please contact when possible   Two attempts to reach pt, left VM to CB each time.  Pt called, left VM to call back to discuss with nurse. Unable to reach after 3 attempts by PEC NT. Routing  to provider for resolution per protocol.

## 2021-07-28 NOTE — Telephone Encounter (Signed)
Pt. Reports he started coughing 3-4 days ago. Has productive cough with green mucus. Has some shortness of breath with coughing "spells at night and I have to sit on the side of the bed." No fever or other symptoms. Has not done an at home COVID 19 test. Requesting to come into the practice tomorrow. Please advise. Reason for Disposition . [1] MILD difficulty breathing (e.g., minimal/no SOB at rest, SOB with walking, pulse <100) AND [2] still present when not coughing  Answer Assessment - Initial Assessment Questions 1. ONSET: "When did the cough begin?"      3 days 2. SEVERITY: "How bad is the cough today?"      Severe 3. SPUTUM: "Describe the color of your sputum" (none, dry cough; clear, white, yellow, green)     Green 4. HEMOPTYSIS: "Are you coughing up any blood?" If so ask: "How much?" (flecks, streaks, tablespoons, etc.)     No 5. DIFFICULTY BREATHING: "Are you having difficulty breathing?" If Yes, ask: "How bad is it?" (e.g., mild, moderate, severe)    - MILD: No SOB at rest, mild SOB with walking, speaks normally in sentences, can lie down, no retractions, pulse < 100.    - MODERATE: SOB at rest, SOB with minimal exertion and prefers to sit, cannot lie down flat, speaks in phrases, mild retractions, audible wheezing, pulse 100-120.    - SEVERE: Very SOB at rest, speaks in single words, struggling to breathe, sitting hunched forward, retractions, pulse > 120      Mild- moderate 6. FEVER: "Do you have a fever?" If Yes, ask: "What is your temperature, how was it measured, and when did it start?"     No 7. CARDIAC HISTORY: "Do you have any history of heart disease?" (e.g., heart attack, congestive heart failure)      No 8. LUNG HISTORY: "Do you have any history of lung disease?"  (e.g., pulmonary embolus, asthma, emphysema)     No 9. PE RISK FACTORS: "Do you have a history of blood clots?" (or: recent major surgery, recent prolonged travel, bedridden)     No 10. OTHER SYMPTOMS: "Do you  have any other symptoms?" (e.g., runny nose, wheezing, chest pain)       Wheezing 11. PREGNANCY: "Is there any chance you are pregnant?" "When was your last menstrual period?"       N/a 12. TRAVEL: "Have you traveled out of the country in the last month?" (e.g., travel history, exposures)       No  Protocols used: Cough - Acute Productive-A-AH

## 2021-07-28 NOTE — Telephone Encounter (Signed)
Called and left vm to return call to the office 

## 2021-07-28 NOTE — Telephone Encounter (Signed)
Called pt unable to reach left VM to call back ?

## 2021-07-29 ENCOUNTER — Ambulatory Visit (HOSPITAL_COMMUNITY)
Admission: EM | Admit: 2021-07-29 | Discharge: 2021-07-29 | Disposition: A | Discharge status: Home / Self Care | Payer: 59 | Attending: Emergency Medicine | Admitting: Emergency Medicine

## 2021-07-29 ENCOUNTER — Encounter (HOSPITAL_COMMUNITY): Payer: Self-pay | Admitting: Emergency Medicine

## 2021-07-29 ENCOUNTER — Other Ambulatory Visit: Payer: Self-pay

## 2021-07-29 DIAGNOSIS — J441 Chronic obstructive pulmonary disease with (acute) exacerbation: Secondary | ICD-10-CM | POA: Diagnosis not present

## 2021-07-29 MED ORDER — PREDNISONE 20 MG PO TABS: 40.0000 mg | ORAL_TABLET | Freq: Every day | ORAL | 0 refills | Status: AC

## 2021-07-29 MED ORDER — AZITHROMYCIN 250 MG PO TABS: 250.0000 mg | ORAL_TABLET | Freq: Every day | ORAL | 0 refills | Status: DC

## 2021-07-29 MED ORDER — ALBUTEROL SULFATE HFA 108 (90 BASE) MCG/ACT IN AERS: 2.0000 | INHALATION_SPRAY | RESPIRATORY_TRACT | 0 refills | Status: DC | PRN

## 2021-07-29 NOTE — ED Provider Notes (Signed)
[facility]    CSN: [id number] Arrival date & time: 07/29/21  0919      History   Chief Complaint Chief Complaint  Patient presents with   Cough    HPI Mathew Bridges is a 60 y.o. male.   Patient here for evaluation of dry cough that has been ongoing for the past 3 days.  Reports cough is worse at night.  Reports history of bronchitis and has used an inhaler in the past.  Has not used inhaler recently as he is out.  Has not used any OTC medications or treatments.  Patient reports that he is still smoking but has tried to cut back.  Denies any trauma, injury, or other precipitating event.  Denies any specific alleviating or aggravating factors.  Denies any fevers, chest pain, N/V/D, numbness, tingling, weakness, abdominal pain, or headaches.    The history is provided by the patient.  Cough Associated symptoms: no fever    Past Medical History:  Diagnosis Date   Asthma    COPD (chronic obstructive pulmonary disease) (HCC)    Folliculitis    Prediabetes    Shortness of breath dyspnea    with exertion    Patient Active Problem List   Diagnosis Date Noted   Skin lesion 02/20/2019   History of bronchitis 12/10/2014   Excess ear wax 12/10/2014    Past Surgical History:  Procedure Laterality Date   CHOLECYSTECTOMY     INSERTION OF MESH N/A 03/22/2016   Procedure: INSERTION OF MESH;  Surgeon: Axel Filler, MD;  Location: [facility] OR;  Service: General;  Laterality: N/A;   VENTRAL HERNIA REPAIR N/A 03/22/2016   Procedure: LAPAROSCOPIC VENTRAL HERNIA;  Surgeon: Axel Filler, MD;  Location: [facility] OR;  Service: General;  Laterality: N/A;       Home Medications    Prior to Admission medications   Medication Sig Start Date End Date Taking? Authorizing Provider  albuterol (VENTOLIN HFA) 108 (90 Base) MCG/ACT inhaler Inhale 2 puffs into the lungs every 4 (four) hours as needed for wheezing or shortness of breath. 07/29/21  Yes Ivette Loyal, NP  azithromycin  (ZITHROMAX) 250 MG tablet Take 1 tablet (250 mg total) by mouth daily. Take first 2 tablets together, then 1 every day until finished. 07/29/21  Yes Ivette Loyal, NP  predniSONE (DELTASONE) 20 MG tablet Take 2 tablets (40 mg total) by mouth daily for 5 days. 07/29/21 08/03/21 Yes Ivette Loyal, NP  albuterol (VENTOLIN HFA) 108 (90 Base) MCG/ACT inhaler Inhale 2 puffs into the lungs every 6 (six) hours as needed for wheezing or shortness of breath. Patient not taking: Reported on 07/22/2021 05/27/21   Anders Simmonds, PA-C  atorvastatin (LIPITOR) 40 MG tablet Take 1 tablet (40 mg total) by mouth daily. 05/27/21   Anders Simmonds, PA-C  Multiple Vitamins-Minerals (CENTRUM MEN PO) Take 1 tablet by mouth daily.    [provider]  mupirocin ointment (BACTROBAN) 2 % Apply topically to scalp 2 times per day Patient not taking: Reported on 07/22/2021 05/27/21   Anders Simmonds, PA-C    Family History Family History  Problem Relation Age of Onset   COPD Paternal Grandfather    Healthy Mother    Healthy Father     Social History Social History   Tobacco Use   Smoking status: Every Day    Packs/day: 0.50    Years: 30.00    Pack years: 15.00    Types: Cigarettes  Smokeless tobacco: Never  Vaping Use   Vaping Use: Never used  Substance Use Topics   Alcohol use: Yes    Comment: occassionally    Drug use: No     Allergies   Patient has no known allergies.   Review of Systems Review of Systems  Constitutional:  Negative for fever.  Respiratory:  Positive for cough.   All other systems reviewed and are negative.   Physical Exam Triage Vital Signs ED Triage Vitals  Enc Vitals Group     BP 07/29/21 1113 (!) 149/79     Pulse Rate 07/29/21 1113 64     Resp 07/29/21 1113 16     Temp 07/29/21 1113 98 F (36.7 C)     Temp Source 07/29/21 1113 Oral     SpO2 07/29/21 1113 96 %     Weight --      Height --      Head Circumference --      Peak Flow --      Pain  Score 07/29/21 1111 0     Pain Loc --      Pain Edu? --      Excl. in GC? --    No data found.  Updated Vital Signs BP (!) 149/79 (BP Location: Right Arm)   Pulse 64   Temp 98 F (36.7 C) (Oral)   Resp 16   SpO2 96%   Visual Acuity Right Eye Distance:   Left Eye Distance:   Bilateral Distance:    Right Eye Near:   Left Eye Near:    Bilateral Near:     Physical Exam Vitals and nursing note reviewed.  Constitutional:      General: He is not in acute distress.    Appearance: Normal appearance. He is not ill-appearing, toxic-appearing or diaphoretic.  HENT:     Head: Normocephalic and atraumatic.     Nose: No congestion.  Eyes:     Conjunctiva/sclera: Conjunctivae normal.  Cardiovascular:     Rate and Rhythm: Normal rate and regular rhythm.     Pulses: Normal pulses.     Heart sounds: Normal heart sounds.  Pulmonary:     Effort: Pulmonary effort is normal.     Breath sounds: Wheezing present.  Abdominal:     General: Abdomen is flat.  Musculoskeletal:        General: Normal range of motion.     Cervical back: Normal range of motion.  Skin:    General: Skin is warm and dry.  Neurological:     General: No focal deficit present.     Mental Status: He is alert and oriented to person, place, and time.  Psychiatric:        Mood and Affect: Mood normal.     UC Treatments / Results  Labs (all labs ordered are listed, but only abnormal results are displayed) Labs Reviewed - No data to display  EKG   Radiology No results found.  Procedures Procedures (including critical care time)  Medications Ordered in UC Medications - No data to display  Initial Impression / Assessment and Plan / UC Course  I have reviewed the triage vital signs and the nursing notes.  Pertinent labs & imaging results that were available during my care of the patient were reviewed by me and considered in my medical decision making (see chart for details).    Assessment negative for  red flags or concerns.  Likely acute COPD exacerbation.  We will treat  with azithromycin, prednisone, and albuterol inhaler.  May take Tylenol as needed.  Encourage fluids and rest.  Follow-up with primary care for reevaluation of soon as possible.  Strict ED follow-up for any worsening symptoms. Final Clinical Impressions(s) / UC Diagnoses   Final diagnoses:  COPD exacerbation (HCC)     Discharge Instructions      Take the Zithromax 2 pills today.  Then take 1 pill a day until you have finished the pack. Take prednisone 2 pills daily.  Take this in the morning. You can use the albuterol inhaler 2 puffs every 6 hours as needed for wheezing and shortness of breath.  You can take Tylenol as needed for pain relief and fever reduction.   Follow up with your primary care provider as soon as possible for re-evaluation.   If you develop the worse headache of your life, worsening dizziness, nausea/vomiting, blurred vision, slurred speech, difficulty walking, weakness on one side, chest pain, shortness of breath, or altered mental status, call 911 or go directly to the Emergency Department for further evaluation.       ED Prescriptions     Medication Sig Dispense Auth. Provider   azithromycin (ZITHROMAX) 250 MG tablet Take 1 tablet (250 mg total) by mouth daily. Take first 2 tablets together, then 1 every day until finished. 6 tablet Ivette Loyal, NP   albuterol (VENTOLIN HFA) 108 (90 Base) MCG/ACT inhaler Inhale 2 puffs into the lungs every 4 (four) hours as needed for wheezing or shortness of breath. 18 g Ivette Loyal, NP   predniSONE (DELTASONE) 20 MG tablet Take 2 tablets (40 mg total) by mouth daily for 5 days. 10 tablet Ivette Loyal, NP      PDMP not reviewed this encounter.   Ivette Loyal, NP 07/29/21 1145

## 2021-07-29 NOTE — ED Triage Notes (Signed)
Pt presents with dry cough xs 3 days.

## 2021-07-29 NOTE — Discharge Instructions (Signed)
Take the Zithromax 2 pills today.  Then take 1 pill a day until you have finished the pack. Take prednisone 2 pills daily.  Take this in the morning. You can use the albuterol inhaler 2 puffs every 6 hours as needed for wheezing and shortness of breath.  You can take Tylenol as needed for pain relief and fever reduction.   Follow up with your primary care provider as soon as possible for re-evaluation.   If you develop the worse headache of your life, worsening dizziness, nausea/vomiting, blurred vision, slurred speech, difficulty walking, weakness on one side, chest pain, shortness of breath, or altered mental status, call 911 or go directly to the Emergency Department for further evaluation.

## 2021-11-17 ENCOUNTER — Ambulatory Visit (HOSPITAL_COMMUNITY)
Admission: EM | Admit: 2021-11-17 | Discharge: 2021-11-17 | Disposition: A | Discharge status: Home / Self Care | Payer: 59 | Attending: Family Medicine | Admitting: Family Medicine

## 2021-11-17 ENCOUNTER — Encounter (HOSPITAL_COMMUNITY): Payer: Self-pay

## 2021-11-17 ENCOUNTER — Other Ambulatory Visit: Payer: Self-pay

## 2021-11-17 DIAGNOSIS — R109 Unspecified abdominal pain: Secondary | ICD-10-CM | POA: Diagnosis not present

## 2021-11-17 DIAGNOSIS — M25511 Pain in right shoulder: Secondary | ICD-10-CM

## 2021-11-17 DIAGNOSIS — J441 Chronic obstructive pulmonary disease with (acute) exacerbation: Secondary | ICD-10-CM | POA: Diagnosis not present

## 2021-11-17 MED ORDER — PREDNISONE 20 MG PO TABS: 40.0000 mg | ORAL_TABLET | Freq: Every day | ORAL | 0 refills | Status: AC

## 2021-11-17 MED ORDER — AZITHROMYCIN 250 MG PO TABS: 250.0000 mg | ORAL_TABLET | Freq: Every day | ORAL | 0 refills | Status: DC

## 2021-11-17 NOTE — ED Provider Notes (Signed)
[facility]    CSN: [id number] Arrival date & time: 11/17/21  3976      History   Chief Complaint Chief Complaint  Patient presents with   Abdominal Pain   Shoulder Pain    HPI Mathew Bridges is a 61 y.o. male.   Patient is here for several issues.  He states he has a "bad cold" for about a week.  Having a cough, no runny nose, congestion.  Mild hard time breathing.  He still smokes about 1ppd.  He does have an inhaler at home but only used it once.  It did help a bit.   He also has pain in the rlq x 1 week.  No n/v.  Normal bms.  Normal urination.  No fevers/chills.  Also with pain into the right shoulder for the same amount of time.  No known injury.  He thinks this abd pain, and shoulder pain is related to the cough/cold he has.   Pain comes/goes.  It did keep him up several night ago.  Not really using anything for this pain.  Able to eat/drink okay.   Nothing makes the pain in the abdomen better/worse.    Past Medical History:  Diagnosis Date   Asthma    COPD (chronic obstructive pulmonary disease) (HCC)    Folliculitis    Prediabetes    Shortness of breath dyspnea    with exertion    Patient Active Problem List   Diagnosis Date Noted   Skin lesion 02/20/2019   History of bronchitis 12/10/2014   Excess ear wax 12/10/2014    Past Surgical History:  Procedure Laterality Date   CHOLECYSTECTOMY     INSERTION OF MESH N/A 03/22/2016   Procedure: INSERTION OF MESH;  Surgeon: Axel Filler, MD;  Location: [facility];  Service: General;  Laterality: N/A;   VENTRAL HERNIA REPAIR N/A 03/22/2016   Procedure: LAPAROSCOPIC VENTRAL HERNIA;  Surgeon: Axel Filler, MD;  Location: [facility];  Service: General;  Laterality: N/A;       Home Medications    Prior to Admission medications   Medication Sig Start Date End Date Taking? Authorizing Provider  albuterol (VENTOLIN HFA) 108 (90 Base) MCG/ACT inhaler Inhale 2 puffs into the lungs every 6 (six) hours as needed  for wheezing or shortness of breath. Patient not taking: Reported on 07/22/2021 05/27/21   Anders Simmonds, PA-C  albuterol (VENTOLIN HFA) 108 (90 Base) MCG/ACT inhaler Inhale 2 puffs into the lungs every 4 (four) hours as needed for wheezing or shortness of breath. 07/29/21   Ivette Loyal, NP  atorvastatin (LIPITOR) 40 MG tablet Take 1 tablet (40 mg total) by mouth daily. 05/27/21   Anders Simmonds, PA-C  azithromycin (ZITHROMAX) 250 MG tablet Take 1 tablet (250 mg total) by mouth daily. Take first 2 tablets together, then 1 every day until finished. 07/29/21   Ivette Loyal, NP  Multiple Vitamins-Minerals (CENTRUM MEN PO) Take 1 tablet by mouth daily.    [provider]  mupirocin ointment (BACTROBAN) 2 % Apply topically to scalp 2 times per day Patient not taking: Reported on 07/22/2021 05/27/21   Anders Simmonds, PA-C    Family History Family History  Problem Relation Age of Onset   COPD Paternal Grandfather    Healthy Mother    Healthy Father     Social History Social History   Tobacco Use   Smoking status: Every Day    Packs/day: 0.50    Years:  30.00    Pack years: 15.00    Types: Cigarettes   Smokeless tobacco: Never  Vaping Use   Vaping Use: Never used  Substance Use Topics   Alcohol use: Yes    Comment: occassionally    Drug use: No     Allergies   Patient has no known allergies.   Review of Systems Review of Systems  Constitutional:  Negative for chills and fever.  HENT:  Negative for congestion.   Respiratory:  Positive for cough.   Gastrointestinal:  Positive for abdominal pain.  Musculoskeletal:  Positive for myalgias.    Physical Exam Triage Vital Signs ED Triage Vitals  Enc Vitals Group     BP 11/17/21 0824 (!) 146/86     Pulse Rate 11/17/21 0824 (!) 59     Resp 11/17/21 0824 17     Temp 11/17/21 0824 97.9 F (36.6 C)     Temp Source 11/17/21 0824 Oral     SpO2 11/17/21 0824 93 %     Weight --      Height --      Head  Circumference --      Peak Flow --      Pain Score 11/17/21 0827 6     Pain Loc --      Pain Edu? --      Excl. in GC? --    No data found.  Updated Vital Signs BP (!) 146/86 (BP Location: Left Arm)    Pulse (!) 59    Temp 97.9 F (36.6 C) (Oral)    Resp 17    SpO2 93%   Visual Acuity Right Eye Distance:   Left Eye Distance:   Bilateral Distance:    Right Eye Near:   Left Eye Near:    Bilateral Near:     Physical Exam HENT:     Head: Normocephalic.     Nose: Nose normal.  Cardiovascular:     Rate and Rhythm: Normal rate and regular rhythm.  Pulmonary:     Effort: Pulmonary effort is normal.     Breath sounds: Normal breath sounds. No wheezing.  Abdominal:     Palpations: Abdomen is soft.     Tenderness: There is abdominal tenderness in the right upper quadrant. There is no guarding or rebound.  Musculoskeletal:     Right shoulder: Tenderness present. No deformity. Normal range of motion.     Cervical back: Normal range of motion.  Neurological:     Mental Status: He is alert.     UC Treatments / Results  Labs (all labs ordered are listed, but only abnormal results are displayed) Labs Reviewed - No data to display  EKG   Radiology No results found.  Procedures Procedures (including critical care time)  Medications Ordered in UC Medications - No data to display  Initial Impression / Assessment and Plan / UC Course  I have reviewed the triage vital signs and the nursing notes.  Pertinent labs & imaging results that were available during my care of the patient were reviewed by me and considered in my medical decision making (see chart for details).   Patient was seen today for various issues.  He does have a cough, which he believes is causing abdominal pain and shoulder pain.  I have treated him for a COPD exacerbation.  The shoulder pain does seem muscular to me.  The abdominal pain is actually in the right upper quadrant/lateral aspect of the abdomen.   This  also seems muscular to me.  Discussed that an xray will likely not show anything.  If he has worsening pain or other worrisome symptoms he should go to the ER for evaluation and further testing.   Final Clinical Impressions(s) / UC Diagnoses   Final diagnoses:  COPD exacerbation (HCC)  Acute pain of right shoulder  Abdominal pain, unspecified abdominal location     Discharge Instructions      You were seen today for various issues.  I have sent out an antibiotic and prednisone for your cough today.  You may also use albuterol inhaler that you have at home.  I think your shoulder pain is muscular.  You may use heat and motrin for this.  Your abdominal pain appear muscular as well.  I recommend tylenol/motrin for pain as well as a heating pad.  If this pain worsens, you develop nausea, vomiting, fever or chills, then please go to the ER for evaluation.      ED Prescriptions     Medication Sig Dispense Auth. Provider   azithromycin (ZITHROMAX) 250 MG tablet Take 1 tablet (250 mg total) by mouth daily. Take first 2 tablets together, then 1 every day until finished. 6 tablet Ancil Dewan, MD   predniSONE (DELTASONE) 20 MG tablet Take 2 tablets (40 mg total) by mouth daily for 5 days. 10 tablet Jannifer Franklin, MD      PDMP not reviewed this encounter.   Jannifer Franklin, MD 11/17/21 [phone]

## 2021-11-17 NOTE — Discharge Instructions (Signed)
You were seen today for various issues.  I have sent out an antibiotic and prednisone for your cough today.  You may also use albuterol inhaler that you have at home.  I think your shoulder pain is muscular.  You may use heat and motrin for this.  Your abdominal pain appear muscular as well.  I recommend tylenol/motrin for pain as well as a heating pad.  If this pain worsens, you develop nausea, vomiting, fever or chills, then please go to the ER for evaluation.

## 2021-11-17 NOTE — ED Triage Notes (Signed)
Pt presents with RLQ abdominal pain and right shoulder pain X 3 days.

## 2022-04-01 ENCOUNTER — Encounter (HOSPITAL_COMMUNITY): Payer: Self-pay

## 2022-04-01 ENCOUNTER — Ambulatory Visit (HOSPITAL_COMMUNITY)
Admission: EM | Admit: 2022-04-01 | Discharge: 2022-04-01 | Disposition: A | Discharge status: Home / Self Care | Payer: 59 | Attending: Internal Medicine | Admitting: Internal Medicine

## 2022-04-01 DIAGNOSIS — J029 Acute pharyngitis, unspecified: Secondary | ICD-10-CM | POA: Diagnosis not present

## 2022-04-01 DIAGNOSIS — R051 Acute cough: Secondary | ICD-10-CM | POA: Diagnosis not present

## 2022-04-01 LAB — POCT RAPID STREP A, ED / UC: Streptococcus, Group A Screen (Direct): NEGATIVE

## 2022-04-01 MED ORDER — IBUPROFEN 600 MG PO TABS: 600.0000 mg | ORAL_TABLET | Freq: Four times a day (QID) | ORAL | 0 refills | Status: DC | PRN

## 2022-04-01 NOTE — Discharge Instructions (Addendum)
Take ibuprofen every 6 hours for throat pain. Try warm salt water gargle or throat spray.  Drink at least 64 oz of water daily.  Please go to the emergency department if symptoms worsen.

## 2022-04-01 NOTE — ED Provider Notes (Signed)
[facility]    CSN: [id number] Arrival date & time: 04/01/22  1553     History   Chief Complaint Chief Complaint  Patient presents with   Cough   Sore Throat    HPI Mathew Bridges is a 61 y.o. male.  Presents with cough and sore throat that began last night.  Cough is dry and nonproductive. 7/10 throat pain.  Not severe pain with swallowing but mostly discomfort. No fevers.  No shortness of breath or trouble breathing. Has not tried any medicine for his symptoms. Denies headache, vision changes, chest pain, abdominal pain, vomiting/diarrhea.  No known sick contacts.  History of COPD and exacerbation.  He is a current smoker but working on cutting back.  Past Medical History:  Diagnosis Date   Asthma    COPD (chronic obstructive pulmonary disease) (HCC)    Folliculitis    Prediabetes    Shortness of breath dyspnea    with exertion    Patient Active Problem List   Diagnosis Date Noted   Skin lesion 02/20/2019   History of bronchitis 12/10/2014   Excess ear wax 12/10/2014    Past Surgical History:  Procedure Laterality Date   CHOLECYSTECTOMY     INSERTION OF MESH N/A 03/22/2016   Procedure: INSERTION OF MESH;  Surgeon: Axel Filler, MD;  [facility];  Service: General;  Laterality: N/A;   VENTRAL HERNIA REPAIR N/A 03/22/2016   Procedure: LAPAROSCOPIC VENTRAL HERNIA;  Surgeon: Axel Filler, MD;  Location: [facility];  Service: General;  Laterality: N/A;       Home Medications    Prior to Admission medications   Medication Sig Start Date End Date Taking? Authorizing Provider  ibuprofen (ADVIL) 600 MG tablet Take 1 tablet (600 mg total) by mouth every 6 (six) hours as needed. 04/01/22  Yes Oceane Fosse, Lurena Joiner, PA-C  albuterol (VENTOLIN HFA) 108 (90 Base) MCG/ACT inhaler Inhale 2 puffs into the lungs every 4 (four) hours as needed for wheezing or shortness of breath. 07/29/21   Ivette Loyal, NP  atorvastatin (LIPITOR) 40 MG tablet Take 1 tablet (40 mg  total) by mouth daily. 05/27/21   Anders Simmonds, PA-C  Multiple Vitamins-Minerals (CENTRUM MEN PO) Take 1 tablet by mouth daily.    [provider]    Family History Family History  Problem Relation Age of Onset   COPD Paternal Grandfather    Healthy Mother    Healthy Father     Social History Social History   Tobacco Use   Smoking status: Every Day    Packs/day: 0.50    Years: 30.00    Total pack years: 15.00    Types: Cigarettes   Smokeless tobacco: Never  Vaping Use   Vaping Use: Never used  Substance Use Topics   Alcohol use: Yes    Comment: occassionally    Drug use: No     Allergies   Patient has no known allergies.   Review of Systems Review of Systems  Respiratory:  Positive for cough.    Per HPI  Physical Exam Triage Vital Signs ED Triage Vitals  Enc Vitals Group     BP 04/01/22 1645 (!) 151/88     Pulse Rate 04/01/22 1645 71     Resp 04/01/22 1645 16     Temp 04/01/22 1645 98.5 F (36.9 C)     Temp Source 04/01/22 1645 Oral     SpO2 04/01/22 1645 95 %     Weight --  Height --      Head Circumference --      Peak Flow --      Pain Score 04/01/22 1644 7     Pain Loc --      Pain Edu? --      Excl. in GC? --    No data found.  Updated Vital Signs BP (!) 151/88 (BP Location: Right Arm)   Pulse 71   Temp 98.5 F (36.9 C) (Oral)   Resp 16   SpO2 95%   Physical Exam Vitals and nursing note reviewed.  Constitutional:      General: He is not in acute distress.    Comments: Very polite and well-appearing, no acute distress  HENT:     Right Ear: Tympanic membrane and ear canal normal.     Left Ear: Tympanic membrane and ear canal normal.     Ears:     Comments: Moderate wax    Mouth/Throat:     Mouth: Mucous membranes are moist.     Pharynx: Uvula midline. Posterior oropharyngeal erythema present. No oropharyngeal exudate.     Tonsils: No tonsillar exudate or tonsillar abscesses.  Eyes:     Conjunctiva/sclera:  Conjunctivae normal.     Pupils: Pupils are equal, round, and reactive to light.  Cardiovascular:     Rate and Rhythm: Normal rate and regular rhythm.     Heart sounds: Normal heart sounds.  Pulmonary:     Effort: Pulmonary effort is normal. No respiratory distress.     Breath sounds: Decreased air movement present. Wheezing present.     Comments: Faint expirational wheezing in the lung bases Abdominal:     Tenderness: There is no abdominal tenderness.  Musculoskeletal:     Cervical back: Normal range of motion.  Lymphadenopathy:     Cervical: No cervical adenopathy.  Neurological:     Mental Status: He is alert and oriented to person, place, and time.      UC Treatments / Results  Labs (all labs ordered are listed, but only abnormal results are displayed) Labs Reviewed  POCT RAPID STREP A, ED / UC    EKG  Radiology No results found.  Procedures Procedures (including critical care time)  Medications Ordered in UC Medications - No data to display  Initial Impression / Assessment and Plan / UC Course  I have reviewed the triage vital signs and the nursing notes.  Pertinent labs & imaging results that were available during my care of the patient were reviewed by me and considered in my medical decision making (see chart for details).  Strep negative.  Some erythema of the throat.  Appears well in no acute distress.  Vitals stable, afebrile. Discussed with patient it's too soon to know what is causing his symptoms.  Could be a virus.  Recommend symptomatic care at home.  He will try ibuprofen every 6 hours for the throat pain with salt water gargles, lozenges, honey.  Cough is dry may be related to smoking.  He will monitor his symptoms and if anything worsens he understands to go to the emergency department.  Otherwise he can follow-up with [place] at the urgent care.  Patient agrees to plan and he is discharged in stable condition.  Final Clinical Impressions(s) / UC Diagnoses    Final diagnoses:  Acute cough  Sore throat     Discharge Instructions      Take ibuprofen every 6 hours for throat pain. Try warm salt water gargle or  throat spray.  Drink at least 64 oz of water daily.  Please go to the emergency department if symptoms worsen.     ED Prescriptions     Medication Sig Dispense Auth. Provider   ibuprofen (ADVIL) 600 MG tablet Take 1 tablet (600 mg total) by mouth every 6 (six) hours as needed. 30 tablet Bonnye Halle, Lurena Joiner, PA-C      PDMP not reviewed this encounter.   Clarann Helvey, Lurena Joiner, [place] 04/01/22 1736

## 2022-04-01 NOTE — ED Triage Notes (Signed)
Pt presents with c/o cough and sore throat that began last night.

## 2022-05-05 ENCOUNTER — Ambulatory Visit: Payer: 59 | Attending: Nurse Practitioner | Admitting: Nurse Practitioner

## 2022-05-05 ENCOUNTER — Encounter: Payer: Self-pay | Admitting: Nurse Practitioner

## 2022-05-05 VITALS — BP 138/75 | HR 57 | Temp 98.1°F | Ht 69.0 in | Wt 169.2 lb

## 2022-05-05 DIAGNOSIS — R7303 Prediabetes: Secondary | ICD-10-CM | POA: Diagnosis not present

## 2022-05-05 DIAGNOSIS — L219 Seborrheic dermatitis, unspecified: Secondary | ICD-10-CM

## 2022-05-05 DIAGNOSIS — L739 Follicular disorder, unspecified: Secondary | ICD-10-CM | POA: Diagnosis not present

## 2022-05-05 DIAGNOSIS — F17210 Nicotine dependence, cigarettes, uncomplicated: Secondary | ICD-10-CM

## 2022-05-05 DIAGNOSIS — D696 Thrombocytopenia, unspecified: Secondary | ICD-10-CM | POA: Diagnosis not present

## 2022-05-05 DIAGNOSIS — E785 Hyperlipidemia, unspecified: Secondary | ICD-10-CM

## 2022-05-05 DIAGNOSIS — J449 Chronic obstructive pulmonary disease, unspecified: Secondary | ICD-10-CM

## 2022-05-05 MED ORDER — KETOCONAZOLE 2 % EX SHAM: 1.0000 | MEDICATED_SHAMPOO | CUTANEOUS | 3 refills | Status: DC

## 2022-05-05 MED ORDER — MUPIROCIN 2 % EX OINT: TOPICAL_OINTMENT | CUTANEOUS | 3 refills | Status: AC

## 2022-05-05 MED ORDER — ALBUTEROL SULFATE HFA 108 (90 BASE) MCG/ACT IN AERS: 2.0000 | INHALATION_SPRAY | RESPIRATORY_TRACT | 0 refills | Status: DC | PRN

## 2022-05-05 NOTE — Progress Notes (Signed)
Assessment & Plan:  Devonn was seen today for copd.  Diagnoses and all orders for this visit:  Prediabetes -     Hemoglobin A1c -     CMP14+EGFR  Thrombocytopenia (HCC) -     CBC with Differential  Folliculitis -     mupirocin ointment (BACTROBAN) 2 %; Apply topically to scalp 2 times per day  Seborrheic dermatitis -     ketoconazole (NIZORAL) 2 % shampoo; Apply 1 Application topically 2 (two) times a week.  Dyslipidemia, goal LDL below 70 -     Lipid panel  COPD without exacerbation (HCC) -     albuterol (VENTOLIN HFA) 108 (90 Base) MCG/ACT inhaler; Inhale 2 puffs into the lungs every 4 (four) hours as needed for wheezing or shortness of breath.  Tobacco dependence due to cigarettes -     CT CHEST LUNG CA SCREEN LOW DOSE W/O CM; Future    Patient has been counseled on age-appropriate routine health concerns for screening and prevention. These are reviewed and up-to-date. Referrals have been placed accordingly. Immunizations are up-to-date or declined.    Subjective:   Chief Complaint  Patient presents with   COPD   HPI Mathew Bridges 61 y.o. male presents to office today for follow up to prediabetes.   He has a past medical history of COPD, Folliculitis, Prediabetes, seborrheic dermatitis, thrombocytopenia, tobacco dependence.   Prediabetes Well controlled with diet only at this time.  Lab Results  Component Value Date   HGBA1C 5.9 (H) 07/22/2021     Requesting refill of shampoo and ointment for folliculitis and seborrheic dermatitis.   Tobacco Dependence Smoking 1ppd. Down from 2ppd. Started smoking 45 years ago. Uses albuterol inhaler as needed. Denies use of more than 2-3 times per week   Review of Systems  Constitutional:  Negative for fever, malaise/fatigue and weight loss.  HENT: Negative.  Negative for nosebleeds.   Eyes: Negative.  Negative for blurred vision, double vision and photophobia.  Respiratory: Negative.  Negative for cough and shortness  of breath.   Cardiovascular: Negative.  Negative for chest pain, palpitations and leg swelling.  Gastrointestinal: Negative.  Negative for heartburn, nausea and vomiting.  Musculoskeletal: Negative.  Negative for myalgias.  Skin:        Seborrheic dermatitis  Neurological: Negative.  Negative for dizziness, focal weakness, seizures and headaches.  Psychiatric/Behavioral: Negative.  Negative for suicidal ideas.     Past Medical History:  Diagnosis Date   COPD (chronic obstructive pulmonary disease) ([place])    Folliculitis    Prediabetes    Shortness of breath dyspnea    with exertion    Past Surgical History:  Procedure Laterality Date   CHOLECYSTECTOMY     INSERTION OF MESH N/A 03/22/2016   Procedure: INSERTION OF MESH;  Surgeon: Ralene Ok, MD;  Location: [facility];  Service: General;  Laterality: N/A;   VENTRAL HERNIA REPAIR N/A 03/22/2016   Procedure: LAPAROSCOPIC VENTRAL HERNIA;  Surgeon: Ralene Ok, MD;  [facility];  Service: General;  Laterality: N/A;    Family History  Problem Relation Age of Onset   COPD Paternal Grandfather    Healthy Mother    Healthy Father     Social History Reviewed with no changes to be made today.   Outpatient Medications Prior to Visit  Medication Sig Dispense Refill   atorvastatin (LIPITOR) 40 MG tablet Take 1 tablet (40 mg total) by mouth daily. 90 tablet 3   ibuprofen (ADVIL) 600 MG tablet  Take 1 tablet (600 mg total) by mouth every 6 (six) hours as needed. 30 tablet 0   Multiple Vitamins-Minerals (CENTRUM MEN PO) Take 1 tablet by mouth daily.     albuterol (VENTOLIN HFA) 108 (90 Base) MCG/ACT inhaler Inhale 2 puffs into the lungs every 4 (four) hours as needed for wheezing or shortness of breath. 18 g 0   No facility-administered medications prior to visit.    No Known Allergies     Objective:    BP 138/75   Pulse (!) 57   Temp 98.1 F (36.7 C) (Oral)   Ht _0  (1.753 m)   Wt 169 lb 3.2 oz (76.7 kg)   SpO2 98%    BMI 24.99 kg/m  Wt Readings from Last 3 Encounters:  05/05/22 169 lb 3.2 oz (76.7 kg)  07/22/21 170 lb 9.6 oz (77.4 kg)  05/27/21 165 lb 4 oz (75 kg)    Physical Exam Vitals and nursing note reviewed.  Constitutional:      Appearance: He is well-developed.  HENT:     Head: Normocephalic and atraumatic.  Cardiovascular:     Rate and Rhythm: Regular rhythm. Bradycardia present.     Heart sounds: Normal heart sounds. No murmur heard.    No friction rub. No gallop.  Pulmonary:     Effort: Pulmonary effort is normal. No tachypnea or respiratory distress.     Breath sounds: Normal breath sounds. No decreased breath sounds, wheezing, rhonchi or rales.  Chest:     Chest wall: No tenderness.  Abdominal:     General: Bowel sounds are normal.     Palpations: Abdomen is soft.  Musculoskeletal:        General: Normal range of motion.     Cervical back: Normal range of motion.  Skin:    General: Skin is warm and dry.  Neurological:     Mental Status: He is alert and oriented to person, place, and time.     Coordination: Coordination normal.  Psychiatric:        Behavior: Behavior normal. Behavior is cooperative.        Thought Content: Thought content normal.        Judgment: Judgment normal.          Patient has been counseled extensively about nutrition and exercise as well as the importance of adherence with medications and regular follow-up. The patient was given clear instructions to go to ER or return to medical center if symptoms don't improve, worsen or new problems develop. The patient verbalized understanding.   Follow-up: Return in about 4 months (around 09/04/2022).   Gildardo Pounds, FNP-BC [facility] and [place] [place], [place]   05/05/2022, 3:16 PM

## 2022-05-05 NOTE — Progress Notes (Signed)
Pt requesting RF of Shampoo & skin cream

## 2022-05-06 LAB — CMP14+EGFR
ALT: 24 IU/L (ref 0–44)
AST: 21 IU/L (ref 0–40)
Albumin/Globulin Ratio: 1.5 (ref 1.2–2.2)
Albumin: 4.7 g/dL (ref 3.9–4.9)
Alkaline Phosphatase: 137 IU/L — ABNORMAL HIGH (ref 44–121)
BUN/Creatinine Ratio: 12 (ref 10–24)
BUN: 10 mg/dL (ref 8–27)
Bilirubin Total: 1 mg/dL (ref 0.0–1.2)
CO2: 21 mmol/L (ref 20–29)
Calcium: 9.7 mg/dL (ref 8.6–10.2)
Chloride: 101 mmol/L (ref 96–106)
Creatinine, Ser: 0.82 mg/dL (ref 0.76–1.27)
Globulin, Total: 3.1 g/dL (ref 1.5–4.5)
Glucose: 115 mg/dL — ABNORMAL HIGH (ref 70–99)
Potassium: 4.5 mmol/L (ref 3.5–5.2)
Sodium: 138 mmol/L (ref 134–144)
Total Protein: 7.8 g/dL (ref 6.0–8.5)
eGFR: 100 mL/min/{1.73_m2} (ref >59)

## 2022-05-06 LAB — LIPID PANEL
Chol/HDL Ratio: 5.2 ratio — ABNORMAL HIGH (ref 0.0–5.0)
Cholesterol, Total: 182 mg/dL (ref 100–199)
HDL: 35 mg/dL — ABNORMAL LOW (ref >39)
LDL Chol Calc (NIH): 106 mg/dL — ABNORMAL HIGH (ref 0–99)
Triglycerides: 238 mg/dL — ABNORMAL HIGH (ref 0–149)
VLDL Cholesterol Cal: 41 mg/dL — ABNORMAL HIGH (ref 5–40)

## 2022-05-06 LAB — CBC WITH DIFFERENTIAL/PLATELET
Basophils Absolute: 0.1 10*3/uL (ref 0.0–0.2)
Basos: 1 %
EOS (ABSOLUTE): 0.2 10*3/uL (ref 0.0–0.4)
Eos: 2 %
Hematocrit: 44.6 % (ref 37.5–51.0)
Hemoglobin: 15.9 g/dL (ref 13.0–17.7)
Immature Grans (Abs): 0 10*3/uL (ref 0.0–0.1)
Immature Granulocytes: 0 %
Lymphocytes Absolute: 2.1 10*3/uL (ref 0.7–3.1)
Lymphs: 29 %
MCH: 32.6 pg (ref 26.6–33.0)
MCHC: 35.7 g/dL (ref 31.5–35.7)
MCV: 92 fL (ref 79–97)
Monocytes Absolute: 0.5 10*3/uL (ref 0.1–0.9)
Monocytes: 7 %
Neutrophils Absolute: 4.3 10*3/uL (ref 1.4–7.0)
Neutrophils: 61 %
Platelets: 116 10*3/uL — ABNORMAL LOW (ref 150–450)
RBC: 4.87 x10E6/uL (ref 4.14–5.80)
RDW: 13.5 % (ref 11.6–15.4)
WBC: 7.1 10*3/uL (ref 3.4–10.8)

## 2022-05-06 LAB — HEMOGLOBIN A1C
Est. average glucose Bld gHb Est-mCnc: 128 mg/dL
Hgb A1c MFr Bld: 6.1 % — ABNORMAL HIGH (ref 4.8–5.6)

## 2022-09-02 ENCOUNTER — Encounter (HOSPITAL_COMMUNITY): Payer: Self-pay

## 2022-09-02 ENCOUNTER — Ambulatory Visit (HOSPITAL_COMMUNITY)
Admission: EM | Admit: 2022-09-02 | Discharge: 2022-09-02 | Disposition: A | Discharge status: Home / Self Care | Payer: Self-pay | Attending: Nurse Practitioner | Admitting: Nurse Practitioner

## 2022-09-02 DIAGNOSIS — J069 Acute upper respiratory infection, unspecified: Secondary | ICD-10-CM | POA: Insufficient documentation

## 2022-09-02 DIAGNOSIS — Z20822 Contact with and (suspected) exposure to covid-19: Secondary | ICD-10-CM | POA: Insufficient documentation

## 2022-09-02 DIAGNOSIS — M25571 Pain in right ankle and joints of right foot: Secondary | ICD-10-CM | POA: Insufficient documentation

## 2022-09-02 MED ORDER — PREDNISONE 20 MG PO TABS: 40.0000 mg | ORAL_TABLET | Freq: Every day | ORAL | 0 refills | Status: AC

## 2022-09-02 MED ORDER — PROMETHAZINE-DM 6.25-15 MG/5ML PO SYRP: 5.0000 mL | ORAL_SOLUTION | ORAL | 0 refills | Status: DC | PRN

## 2022-09-02 MED ORDER — NAPROXEN 500 MG PO TABS: 500.0000 mg | ORAL_TABLET | Freq: Two times a day (BID) | ORAL | 0 refills | Status: DC

## 2022-09-02 NOTE — ED Provider Notes (Signed)
[facility]    CSN: [id number] Arrival date & time: 09/02/22  1146      History   Chief Complaint Chief Complaint  Patient presents with   Cough   Nasal Congestion    HPI Mathew Bridges is a 61 y.o. male.   Subjective:   Mathew Bridges is a 61 y.o. male who presents for evaluation of symptoms of a URI. Symptoms include yellow nasal discharge, productive cough, and nasal congestion. Onset of symptoms was 2 days ago and has been unchanged since that time. He denies any fevers, chills, body aches, sore throat, dyspnea, headache, nausea, vomiting or diarrhea. His girlfriend was positive for COVID a couple of weeks ago during the Thanksgiving holiday. Patient denies any history of COVID. He reports being vaccinated against COVID but has not had any boosters. Patient has a history of COPD, HLD and tobacco abuse. He hasn't tried anything for any of these symptoms.   Mathew Bridges also complains of pain in the medial aspect of the right ankle without radiation. Patient denies any injury.  Onset of symptoms was about 2 weeks ago. Patient describes pain as throbbing. Pain severity now is 5 /10. Pain is aggravated by movement and weight bearing. Pain is alleviated by rest, acetaminophen, and epsom salt soaks. Symptoms associated with pain include swelling.  Patient reports that the swelling tends to be worse at the end of the day. He denies any numbness, tingling, weakness, loss of sensation, loss of motion, or the inability to bear weight to the right lower extremity. The patient denies any other issues.   The following portions of the patient's history were reviewed and updated as appropriate: allergies, current medications, past family history, past medical history, past social history, past surgical history, and problem list.       Past Medical History:  Diagnosis Date   COPD (chronic obstructive pulmonary disease) (HCC)    Folliculitis    Prediabetes    Shortness of breath  dyspnea    with exertion    Patient Active Problem List   Diagnosis Date Noted   Skin lesion 02/20/2019   History of bronchitis 12/10/2014   Excess ear wax 12/10/2014    Past Surgical History:  Procedure Laterality Date   CHOLECYSTECTOMY     INSERTION OF MESH N/A 03/22/2016   Procedure: INSERTION OF MESH;  Surgeon: Axel Filler, MD;  [facility];  Service: General;  Laterality: N/A;   VENTRAL HERNIA REPAIR N/A 03/22/2016   Procedure: LAPAROSCOPIC VENTRAL HERNIA;  Surgeon: Axel Filler, MD;  [facility];  Service: General;  Laterality: N/A;       Home Medications    Prior to Admission medications   Medication Sig Start Date End Date Taking? Authorizing Provider  naproxen (NAPROSYN) 500 MG tablet Take 1 tablet (500 mg total) by mouth 2 (two) times daily. Take with food. Do not take any additional NSAIDs with this medications (aleve, motrin, ibuprofen, aspirin) 09/02/22  Yes Lurline Idol, FNP  predniSONE (DELTASONE) 20 MG tablet Take 2 tablets (40 mg total) by mouth daily for 5 days. 09/02/22 09/07/22 Yes Lurline Idol, FNP  promethazine-dextromethorphan (PROMETHAZINE-DM) 6.25-15 MG/5ML syrup Take 5 mLs by mouth every 4 (four) hours as needed for cough. 09/02/22  Yes Lurline Idol, FNP  albuterol (VENTOLIN HFA) 108 (90 Base) MCG/ACT inhaler Inhale 2 puffs into the lungs every 4 (four) hours as needed for wheezing or shortness of breath. 05/05/22   Claiborne Rigg, NP  atorvastatin (LIPITOR)  40 MG tablet Take 1 tablet (40 mg total) by mouth daily. 05/27/21   Anders Simmonds, PA-C  ibuprofen (ADVIL) 600 MG tablet Take 1 tablet (600 mg total) by mouth every 6 (six) hours as needed. 04/01/22   Rising, Lurena Joiner, PA-C  ketoconazole (NIZORAL) 2 % shampoo Apply 1 Application topically 2 (two) times a week. 05/06/22   Claiborne Rigg, NP  Multiple Vitamins-Minerals (CENTRUM MEN PO) Take 1 tablet by mouth daily.    [provider]  mupirocin ointment (BACTROBAN) 2 %  Apply topically to scalp 2 times per day 05/05/22   Claiborne Rigg, NP    Family History Family History  Problem Relation Age of Onset   COPD Paternal Grandfather    Healthy Mother    Healthy Father     Social History Social History   Tobacco Use   Smoking status: Every Day    Packs/day: 0.50    Years: 30.00    Total pack years: 15.00    Types: Cigarettes   Smokeless tobacco: Never  Vaping Use   Vaping Use: Never used  Substance Use Topics   Alcohol use: Yes    Comment: occassionally    Drug use: No     Allergies   Patient has no known allergies.   Review of Systems Review of Systems  Constitutional:  Negative for chills, diaphoresis, fatigue and fever.  HENT:  Positive for congestion and rhinorrhea. Negative for sore throat.   Respiratory:  Positive for cough. Negative for shortness of breath and wheezing.   Gastrointestinal:  Negative for nausea and vomiting.  Musculoskeletal:  Positive for arthralgias. Negative for myalgias.  Neurological:  Negative for headaches.     Physical Exam Triage Vital Signs ED Triage Vitals  Enc Vitals Group     BP 09/02/22 1502 (!) 152/62     Pulse Rate 09/02/22 1502 69     Resp 09/02/22 1502 18     Temp 09/02/22 1502 98.2 F (36.8 C)     Temp Source 09/02/22 1502 Oral     SpO2 09/02/22 1502 98 %     Weight --      Height --      Head Circumference --      Peak Flow --      Pain Score 09/02/22 1504 5     Pain Loc --      Pain Edu? --      Excl. in GC? --    No data found.  Updated Vital Signs BP (!) 152/62 (BP Location: Left Arm)   Pulse 69   Temp 98.2 F (36.8 C) (Oral)   Resp 18   SpO2 98%   Visual Acuity Right Eye Distance:   Left Eye Distance:   Bilateral Distance:    Right Eye Near:   Left Eye Near:    Bilateral Near:     Physical Exam Constitutional:      Appearance: Normal appearance.  HENT:     Head: Normocephalic.     Nose: Nose normal.     Mouth/Throat:     Mouth: Mucous membranes are  moist.  Eyes:     Conjunctiva/sclera: Conjunctivae normal.  Cardiovascular:     Rate and Rhythm: Normal rate and regular rhythm.  Pulmonary:     Effort: Pulmonary effort is normal.     Breath sounds: Normal breath sounds.  Musculoskeletal:        General: Normal range of motion.     Cervical back:  Normal range of motion and neck supple.     Right ankle: Swelling present. No deformity, ecchymosis or lacerations. Tenderness present over the medial malleolus. Normal range of motion.     Right Achilles Tendon: Normal.     Left ankle: Normal.  Skin:    General: Skin is warm and dry.  Neurological:     General: No focal deficit present.     Mental Status: He is alert and oriented to person, place, and time.      UC Treatments / Results  Labs (all labs ordered are listed, but only abnormal results are displayed) Labs Reviewed  SARS CORONAVIRUS 2 (TAT 6-24 HRS)    EKG   Radiology No results found.  Procedures Procedures (including critical care time)  Medications Ordered in UC Medications - No data to display  Initial Impression / Assessment and Plan / UC Course  I have reviewed the triage vital signs and the nursing notes.  Pertinent labs & imaging results that were available during my care of the patient were reviewed by me and considered in my medical decision making (see chart for details).    61 yo male presenting with viral URI, COVID exposure and right ankle pain with swelling.  Patient afebrile.  Nontoxic.  Physical exam as above.  COVID test pending. Suggested symptomatic OTC remedies.  Course of steroids and anti-inflammatories for his ankle pain.  Patient follow-up with orthopedics if no improvement in symptoms.  Further recommendations pending results of COVID test.  Today's evaluation has revealed no signs of a dangerous process. Discussed diagnosis with patient and/or guardian. Patient and/or guardian aware of their diagnosis, possible red flag symptoms to  watch out for and need for close follow up. Patient and/or guardian understands verbal and written discharge instructions. Patient and/or guardian comfortable with plan and disposition.  Patient and/or guardian has a clear mental status at this time, good insight into illness (after discussion and teaching) and has clear judgment to make decisions regarding their care  Documentation was completed with the aid of voice recognition software. Transcription may contain typographical errors. Final Clinical Impressions(s) / UC Diagnoses   Final diagnoses:  Viral URI with cough  Exposure to COVID-19 virus  Acute right ankle pain     Discharge Instructions      Take medications as prescribed. You may take tylenol in addition to the prescribed medications as needed for fevers/headache/body aches. Drink plenty of fluids. Stay in home isolation until you receive results of your COVID test. You will only be notified for positive results. You may go online to MyChart and review your results. Go to the ED immediately if you get worse or have any other symptoms.        ED Prescriptions     Medication Sig Dispense Auth. Provider   naproxen (NAPROSYN) 500 MG tablet Take 1 tablet (500 mg total) by mouth 2 (two) times daily. Take with food. Do not take any additional NSAIDs with this medications (aleve, motrin, ibuprofen, aspirin) 30 tablet Younique Casad, [place], FNP   predniSONE (DELTASONE) 20 MG tablet Take 2 tablets (40 mg total) by mouth daily for 5 days. 10 tablet Lurline Idol, FNP   promethazine-dextromethorphan (PROMETHAZINE-DM) 6.25-15 MG/5ML syrup Take 5 mLs by mouth every 4 (four) hours as needed for cough. 118 mL Lurline Idol, FNP      PDMP not reviewed this encounter.   Lurline Idol, [place] 09/02/22 1550

## 2022-09-02 NOTE — Discharge Instructions (Signed)
Take medications as prescribed. You may take tylenol in addition to the prescribed medications as needed for fevers/headache/body aches. Drink plenty of fluids. Stay in home isolation until you receive results of your COVID test. You will only be notified for positive results. You may go online to MyChart and review your results. Go to the ED immediately if you get worse or have any other symptoms.

## 2022-09-02 NOTE — ED Triage Notes (Signed)
Pt reports right ankle swelling for several months.

## 2022-09-02 NOTE — ED Triage Notes (Signed)
Pt reports cough and congestion x 2 days. Pt reports cough up yellow phlegm.

## 2022-09-03 LAB — SARS CORONAVIRUS 2 (TAT 6-24 HRS): SARS Coronavirus 2: NEGATIVE

## 2022-09-08 ENCOUNTER — Encounter: Payer: Self-pay | Admitting: Nurse Practitioner

## 2022-09-08 ENCOUNTER — Ambulatory Visit: Payer: Self-pay | Attending: Nurse Practitioner | Admitting: Nurse Practitioner

## 2022-09-08 VITALS — BP 136/60 | HR 53 | Ht 69.0 in | Wt 171.6 lb

## 2022-09-08 DIAGNOSIS — R7303 Prediabetes: Secondary | ICD-10-CM

## 2022-09-08 DIAGNOSIS — D696 Thrombocytopenia, unspecified: Secondary | ICD-10-CM

## 2022-09-08 DIAGNOSIS — M25571 Pain in right ankle and joints of right foot: Secondary | ICD-10-CM

## 2022-09-08 MED ORDER — PREDNISONE 20 MG PO TABS: 40.0000 mg | ORAL_TABLET | Freq: Every day | ORAL | 0 refills | Status: AC

## 2022-09-08 NOTE — Progress Notes (Signed)
Assessment & Plan:  Mathew Bridges was seen today for ankle pain.  Diagnoses and all orders for this visit:  Acute right ankle pain -     predniSONE (DELTASONE) 20 MG tablet; Take 2 tablets (40 mg total) by mouth daily with breakfast for 7 days. -     Uric Acid  Prediabetes -     CMP14+EGFR -     Hemoglobin A1c  Low platelet count (HCC) -     CBC with Differential    Patient has been counseled on age-appropriate routine health concerns for screening and prevention. These are reviewed and up-to-date. Referrals have been placed accordingly. Immunizations are up-to-date or declined.    Subjective:   Chief Complaint  Patient presents with   Ankle Pain    Mathew Bridges 61 y.o. male presents to office today for right ankle pain.  Right ankle pain  ongoing over the past month. He denies any injury or falls. Pain is located in the medial aspect of the right ankle without radiation. Patient describes pain as throbbing. Pain severity now is  6 /10. Pain is aggravated by movement and weight bearing. Pain is alleviated by rest, acetaminophen, and epsom salt soaks. Symptoms associated with pain include swelling. Patient reports that the swelling tends to be worse at the end of the day after standing on his feet at work for long periods of time. He denies any numbness, tingling, weakness, loss of sensation, loss of motion, or the inability to bear weight to the right lower extremity. The patient denies any other issues.   Review of Systems  Constitutional:  Negative for fever, malaise/fatigue and weight loss.  HENT: Negative.  Negative for nosebleeds.   Eyes: Negative.  Negative for blurred vision, double vision and photophobia.  Respiratory: Negative.  Negative for cough and shortness of breath.   Cardiovascular: Negative.  Negative for chest pain, palpitations and leg swelling.  Gastrointestinal: Negative.  Negative for heartburn, nausea and vomiting.  Musculoskeletal:  Positive for joint pain.  Negative for falls and myalgias.  Neurological: Negative.  Negative for dizziness, focal weakness, seizures and headaches.  Psychiatric/Behavioral: Negative.  Negative for suicidal ideas.     Past Medical History:  Diagnosis Date   COPD (chronic obstructive pulmonary disease) ([place])    Folliculitis    Prediabetes    Shortness of breath dyspnea    with exertion    Past Surgical History:  Procedure Laterality Date   CHOLECYSTECTOMY     INSERTION OF MESH N/A 03/22/2016   Procedure: INSERTION OF MESH;  Surgeon: Ralene Ok, MD;  Location: [facility];  Service: General;  Laterality: N/A;   VENTRAL HERNIA REPAIR N/A 03/22/2016   Procedure: LAPAROSCOPIC VENTRAL HERNIA;  Surgeon: Ralene Ok, MD;  [facility];  Service: General;  Laterality: N/A;    Family History  Problem Relation Age of Onset   COPD Paternal Grandfather    Healthy Mother    Healthy Father     Social History Reviewed with no changes to be made today.   Outpatient Medications Prior to Visit  Medication Sig Dispense Refill   albuterol (VENTOLIN HFA) 108 (90 Base) MCG/ACT inhaler Inhale 2 puffs into the lungs every 4 (four) hours as needed for wheezing or shortness of breath. 18 g 0   atorvastatin (LIPITOR) 40 MG tablet Take 1 tablet (40 mg total) by mouth daily. 90 tablet 3   ketoconazole (NIZORAL) 2 % shampoo Apply 1 Application topically 2 (two) times a week. 120 mL  3   Multiple Vitamins-Minerals (CENTRUM MEN PO) Take 1 tablet by mouth daily.     mupirocin ointment (BACTROBAN) 2 % Apply topically to scalp 2 times per day 60 g 3   naproxen (NAPROSYN) 500 MG tablet Take 1 tablet (500 mg total) by mouth 2 (two) times daily. Take with food. Do not take any additional NSAIDs with this medications (aleve, motrin, ibuprofen, aspirin) 30 tablet 0   ibuprofen (ADVIL) 600 MG tablet Take 1 tablet (600 mg total) by mouth every 6 (six) hours as needed. 30 tablet 0   promethazine-dextromethorphan (PROMETHAZINE-DM) 6.25-15  MG/5ML syrup Take 5 mLs by mouth every 4 (four) hours as needed for cough. 118 mL 0   No facility-administered medications prior to visit.    No Known Allergies     Objective:    BP 136/60   Pulse (!) 53   Ht _0  (1.753 m)   Wt 171 lb 9.6 oz (77.8 kg)   SpO2 98%   BMI 25.34 kg/m  Wt Readings from Last 3 Encounters:  09/08/22 171 lb 9.6 oz (77.8 kg)  05/05/22 169 lb 3.2 oz (76.7 kg)  07/22/21 170 lb 9.6 oz (77.4 kg)    Physical Exam Vitals and nursing note reviewed.  Constitutional:      Appearance: He is well-developed.  HENT:     Head: Normocephalic and atraumatic.  Cardiovascular:     Rate and Rhythm: Normal rate and regular rhythm.     Heart sounds: Normal heart sounds. No murmur heard.    No friction rub. No gallop.  Pulmonary:     Effort: Pulmonary effort is normal. No tachypnea or respiratory distress.     Breath sounds: Normal breath sounds. No decreased breath sounds, wheezing, rhonchi or rales.  Chest:     Chest wall: No tenderness.  Abdominal:     General: Bowel sounds are normal.     Palpations: Abdomen is soft.  Musculoskeletal:        General: Normal range of motion.     Cervical back: Normal range of motion.     Right ankle: Tenderness present over the lateral malleolus.  Skin:    General: Skin is warm and dry.  Neurological:     Mental Status: He is alert and oriented to person, place, and time.     Coordination: Coordination normal.  Psychiatric:        Behavior: Behavior normal. Behavior is cooperative.        Thought Content: Thought content normal.        Judgment: Judgment normal.          Patient has been counseled extensively about nutrition and exercise as well as the importance of adherence with medications and regular follow-up. The patient was given clear instructions to go to ER or return to medical center if symptoms don't improve, worsen or new problems develop. The patient verbalized understanding.   Follow-up: Return in  about 6 months (around 03/10/2023) for prediabetes.   Gildardo Pounds, FNP-BC [facility] and [facility], [place]   09/08/2022, 9:46 AM

## 2022-09-08 NOTE — Progress Notes (Signed)
Right ankle pain.Swelling was noticed 12/6. Seen in UC, but medication prescribed is not working.

## 2022-09-09 LAB — CMP14+EGFR
ALT: 17 IU/L (ref 0–44)
AST: 14 IU/L (ref 0–40)
Albumin/Globulin Ratio: 1.5 (ref 1.2–2.2)
Albumin: 4 g/dL (ref 3.9–4.9)
Alkaline Phosphatase: 123 IU/L — ABNORMAL HIGH (ref 44–121)
BUN/Creatinine Ratio: 15 (ref 10–24)
BUN: 11 mg/dL (ref 8–27)
Bilirubin Total: 0.8 mg/dL (ref 0.0–1.2)
CO2: 26 mmol/L (ref 20–29)
Calcium: 9.1 mg/dL (ref 8.6–10.2)
Chloride: 102 mmol/L (ref 96–106)
Creatinine, Ser: 0.71 mg/dL — ABNORMAL LOW (ref 0.76–1.27)
Globulin, Total: 2.7 g/dL (ref 1.5–4.5)
Glucose: 126 mg/dL — ABNORMAL HIGH (ref 70–99)
Potassium: 4 mmol/L (ref 3.5–5.2)
Sodium: 142 mmol/L (ref 134–144)
Total Protein: 6.7 g/dL (ref 6.0–8.5)
eGFR: 104 mL/min/{1.73_m2} (ref >59)

## 2022-09-09 LAB — CBC WITH DIFFERENTIAL/PLATELET
Basophils Absolute: 0.1 10*3/uL (ref 0.0–0.2)
Basos: 1 %
EOS (ABSOLUTE): 0.1 10*3/uL (ref 0.0–0.4)
Eos: 2 %
Hematocrit: 45.8 % (ref 37.5–51.0)
Hemoglobin: 15.8 g/dL (ref 13.0–17.7)
Immature Grans (Abs): 0.1 10*3/uL (ref 0.0–0.1)
Immature Granulocytes: 2 %
Lymphocytes Absolute: 1.7 10*3/uL (ref 0.7–3.1)
Lymphs: 25 %
MCH: 32.9 pg (ref 26.6–33.0)
MCHC: 34.5 g/dL (ref 31.5–35.7)
MCV: 95 fL (ref 79–97)
Monocytes Absolute: 0.6 10*3/uL (ref 0.1–0.9)
Monocytes: 9 %
Neutrophils Absolute: 4.2 10*3/uL (ref 1.4–7.0)
Neutrophils: 61 %
Platelets: 106 10*3/uL — ABNORMAL LOW (ref 150–450)
RBC: 4.8 x10E6/uL (ref 4.14–5.80)
RDW: 14 % (ref 11.6–15.4)
WBC: 6.8 10*3/uL (ref 3.4–10.8)

## 2022-09-09 LAB — HEMOGLOBIN A1C
Est. average glucose Bld gHb Est-mCnc: 131 mg/dL
Hgb A1c MFr Bld: 6.2 % — ABNORMAL HIGH (ref 4.8–5.6)

## 2022-09-09 LAB — URIC ACID: Uric Acid: 5.2 mg/dL (ref 3.8–8.4)

## 2022-09-11 ENCOUNTER — Other Ambulatory Visit: Payer: Self-pay | Admitting: Nurse Practitioner

## 2022-09-11 DIAGNOSIS — D696 Thrombocytopenia, unspecified: Secondary | ICD-10-CM

## 2022-10-05 ENCOUNTER — Other Ambulatory Visit: Payer: Self-pay | Admitting: Medical Oncology

## 2022-10-05 DIAGNOSIS — D696 Thrombocytopenia, unspecified: Secondary | ICD-10-CM

## 2022-10-06 ENCOUNTER — Inpatient Hospital Stay: Payer: Self-pay

## 2022-10-06 ENCOUNTER — Inpatient Hospital Stay: Payer: Self-pay | Attending: Internal Medicine | Admitting: Internal Medicine

## 2022-10-06 ENCOUNTER — Other Ambulatory Visit: Payer: Self-pay

## 2022-10-06 VITALS — BP 141/63 | HR 50 | Temp 97.0°F | Resp 18 | Wt 167.2 lb

## 2022-10-06 DIAGNOSIS — E785 Hyperlipidemia, unspecified: Secondary | ICD-10-CM | POA: Insufficient documentation

## 2022-10-06 DIAGNOSIS — D696 Thrombocytopenia, unspecified: Secondary | ICD-10-CM | POA: Insufficient documentation

## 2022-10-06 DIAGNOSIS — E119 Type 2 diabetes mellitus without complications: Secondary | ICD-10-CM | POA: Insufficient documentation

## 2022-10-06 DIAGNOSIS — F17210 Nicotine dependence, cigarettes, uncomplicated: Secondary | ICD-10-CM | POA: Insufficient documentation

## 2022-10-06 LAB — CMP (CANCER CENTER ONLY)
ALT: 14 U/L (ref 0–44)
AST: 15 U/L (ref 15–41)
Albumin: 4.3 g/dL (ref 3.5–5.0)
Alkaline Phosphatase: 110 U/L (ref 38–126)
Anion gap: 4 — ABNORMAL LOW (ref 5–15)
BUN: 8 mg/dL (ref 8–23)
CO2: 31 mmol/L (ref 22–32)
Calcium: 9.4 mg/dL (ref 8.9–10.3)
Chloride: 106 mmol/L (ref 98–111)
Creatinine: 0.73 mg/dL (ref 0.61–1.24)
GFR, Estimated: >60 mL/min (ref >60)
Glucose, Bld: 98 mg/dL (ref 70–99)
Potassium: 3.5 mmol/L (ref 3.5–5.1)
Sodium: 141 mmol/L (ref 135–145)
Total Bilirubin: 1.3 mg/dL — ABNORMAL HIGH (ref 0.3–1.2)
Total Protein: 7.3 g/dL (ref 6.5–8.1)

## 2022-10-06 LAB — CBC WITH DIFFERENTIAL (CANCER CENTER ONLY)
Abs Immature Granulocytes: 0.02 10*3/uL (ref 0.00–0.07)
Basophils Absolute: 0 10*3/uL (ref 0.0–0.1)
Basophils Relative: 1 %
Eosinophils Absolute: 0.1 10*3/uL (ref 0.0–0.5)
Eosinophils Relative: 2 %
HCT: 42.9 % (ref 39.0–52.0)
Hemoglobin: 15.3 g/dL (ref 13.0–17.0)
Immature Granulocytes: 0 %
Lymphocytes Relative: 35 %
Lymphs Abs: 1.7 10*3/uL (ref 0.7–4.0)
MCH: 33.6 pg (ref 26.0–34.0)
MCHC: 35.7 g/dL (ref 30.0–36.0)
MCV: 94.1 fL (ref 80.0–100.0)
Monocytes Absolute: 0.5 10*3/uL (ref 0.1–1.0)
Monocytes Relative: 9 %
Neutro Abs: 2.5 10*3/uL (ref 1.7–7.7)
Neutrophils Relative %: 53 %
Platelet Count: 111 10*3/uL — ABNORMAL LOW (ref 150–400)
RBC: 4.56 MIL/uL (ref 4.22–5.81)
RDW: 15.1 % (ref 11.5–15.5)
Smear Review: NORMAL
WBC Count: 4.8 10*3/uL (ref 4.0–10.5)
nRBC: 0 % (ref 0.0–0.2)

## 2022-10-06 NOTE — Progress Notes (Signed)
[place] [phone]   [phone]  CONSULT NOTE  REFERRING PHYSICIAN: Geryl Rankins, NP  REASON FOR CONSULTATION:  62 years old white male with thrombocytopenia.  HPI Mathew Bridges is a 62 y.o. male with past medical history significant for COPD, but had diabetes, history of folliculitis as well as dyslipidemia and long history of his smoking.  The patient was seen by his primary care physician for routine evaluation and management of his dyslipidemia as well as complaining of ankle swelling.  He was treated at the emergency room before with naproxen with some improvement in his swelling and inflammation.  During his blood work he was noticed to have persistent decrease in his platelet count.  The patient was referred to me today for evaluation and recommendation regarding his thrombocytopenia.  He has no bleeding, bruises or ecchymosis.  He has no complaints except for the mild swelling in the lower extremities.  He has no chest pain, shortness breath, cough or hemoptysis.  He has no nausea, vomiting, diarrhea or constipation.  He has no headache or visual changes.  He has no recent weight loss or night sweats. Family history significant for father died in a car accident and mother died from old age. The patient is single and has no children.  He works at the [employer].  He has a history of smoking 1 pack/day for around 42 years.  He drinks alcohol occasionally and no history of drug abuse.  HPI  Past Medical History:  Diagnosis Date   COPD (chronic obstructive pulmonary disease) ([place])    Folliculitis    Prediabetes    Shortness of breath dyspnea    with exertion    Past Surgical History:  Procedure Laterality Date   CHOLECYSTECTOMY     INSERTION OF MESH N/A 03/22/2016   Procedure: INSERTION OF MESH;  Surgeon: Ralene Ok, MD;  Location: [facility] OR;  Service: General;  Laterality: N/A;   VENTRAL HERNIA REPAIR N/A 03/22/2016   Procedure:  LAPAROSCOPIC VENTRAL HERNIA;  Surgeon: Ralene Ok, MD;  [facility];  Service: General;  Laterality: N/A;    Family History  Problem Relation Age of Onset   COPD Paternal Grandfather    Healthy Mother    Healthy Father     Social History Social History   Tobacco Use   Smoking status: Every Day    Packs/day: 0.50    Years: 30.00    Total pack years: 15.00    Types: Cigarettes   Smokeless tobacco: Never  Vaping Use   Vaping Use: Never used  Substance Use Topics   Alcohol use: Yes    Comment: occassionally    Drug use: No    No Known Allergies  Current Outpatient Medications  Medication Sig Dispense Refill   albuterol (VENTOLIN HFA) 108 (90 Base) MCG/ACT inhaler Inhale 2 puffs into the lungs every 4 (four) hours as needed for wheezing or shortness of breath. 18 g 0   atorvastatin (LIPITOR) 40 MG tablet Take 1 tablet (40 mg total) by mouth daily. 90 tablet 3   ketoconazole (NIZORAL) 2 % shampoo Apply 1 Application topically 2 (two) times a week. 120 mL 3   Multiple Vitamins-Minerals (CENTRUM MEN PO) Take 1 tablet by mouth daily.     mupirocin ointment (BACTROBAN) 2 % Apply topically to scalp 2 times per day 60 g 3   naproxen (NAPROSYN) 500 MG tablet Take 1 tablet (500 mg total) by mouth 2 (two) times  daily. Take with food. Do not take any additional NSAIDs with this medications (aleve, motrin, ibuprofen, aspirin) 30 tablet 0   No current facility-administered medications for this visit.    Review of Systems  Constitutional: negative Eyes: negative Ears, nose, mouth, throat, and face: negative Respiratory: negative Cardiovascular: negative Gastrointestinal: negative Genitourinary:negative Integument/breast: negative Hematologic/lymphatic: negative Musculoskeletal:negative Neurological: negative Behavioral/Psych: negative Endocrine: negative Allergic/Immunologic: negative  Physical Exam  FAO:ZHYQM, healthy, no distress, well nourished, and well  developed SKIN: skin color, texture, turgor are normal, no rashes or significant lesions HEAD: Normocephalic, No masses, lesions, tenderness or abnormalities EYES: normal, PERRLA, Conjunctiva are pink and non-injected EARS: External ears normal, Canals clear OROPHARYNX:no exudate, no erythema, and lips, buccal mucosa, and tongue normal  NECK: supple, no adenopathy, no JVD LYMPH:  no palpable lymphadenopathy, no hepatosplenomegaly LUNGS: clear to auscultation , and palpation HEART: regular rate & rhythm, no murmurs, and no gallops ABDOMEN:abdomen soft, non-tender, normal bowel sounds, and no masses or organomegaly BACK: Back symmetric, no curvature., No CVA tenderness EXTREMITIES:no joint deformities, effusion, or inflammation, no edema  NEURO: alert & oriented x 3 with fluent speech, no focal motor/sensory deficits  PERFORMANCE STATUS: ECOG 1  LABORATORY DATA: Lab Results  Component Value Date   WBC 6.8 09/08/2022   HGB 15.8 09/08/2022   HCT 45.8 09/08/2022   MCV 95 09/08/2022   PLT 106 (L) 09/08/2022      Chemistry      Component Value Date/Time   NA 142 09/08/2022 0953   K 4.0 09/08/2022 0953   CL 102 09/08/2022 0953   CO2 26 09/08/2022 0953   BUN 11 09/08/2022 0953   CREATININE 0.71 (L) 09/08/2022 0953   CREATININE 0.69 12/10/2014 1550      Component Value Date/Time   CALCIUM 9.1 09/08/2022 0953   ALKPHOS 123 (H) 09/08/2022 0953   AST 14 09/08/2022 0953   ALT 17 09/08/2022 0953   BILITOT 0.8 09/08/2022 0953       RADIOGRAPHIC STUDIES: No results found.  ASSESSMENT: This is a very pleasant 62 years old white male presented for evaluation of mild thrombocytopenia likely drug-induced secondary to treatment with Lipitor and naproxen.  This could be also idiopathic thrombocytopenic purpura but the patient has very mild thrombocytopenia and currently asymptomatic.   PLAN: I had a lengthy discussion with the patient today about his condition and treatment options. I  explained to the patient that he had mild thrombocytopenia and he is currently asymptomatic and no need for intervention. Repeat CBC today showed platelet count of 111,000. I recommended for the patient to continue on observation for now and close monitoring. I do not see any need for any additional studies at this point but I will be happy to see the patient if he becomes symptomatic with any significant bleeding, bruises or ecchymosis or if he has platelets count less than 50,000. The patient was advised to decrease the amount of NSAIDs that he takes on daily basis. He was advised to call immediately if he has any other concerning symptoms in the interval. The patient voices understanding of current disease status and treatment options and is in agreement with the current care plan.  All questions were answered. The patient knows to call the clinic with any problems, questions or concerns. We can certainly see the patient much sooner if necessary.  Thank you so much for allowing me to participate in the care of RONNY KORFF. I will continue to follow up the patient with you  and assist in his care.  The total time spent in the appointment was 60 minutes.  Disclaimer: This note was dictated with voice recognition software. Similar sounding words can inadvertently be transcribed and may not be corrected upon review.   Eilleen Kempf October 06, 2022, 11:27 AM

## 2023-03-16 ENCOUNTER — Ambulatory Visit: Payer: Self-pay | Admitting: Nurse Practitioner

## 2023-03-16 ENCOUNTER — Ambulatory Visit: Payer: Self-pay | Attending: Nurse Practitioner | Admitting: Nurse Practitioner

## 2023-03-16 ENCOUNTER — Encounter: Payer: Self-pay | Admitting: Nurse Practitioner

## 2023-03-16 VITALS — BP 116/68 | HR 53 | Ht 69.0 in | Wt 165.0 lb

## 2023-03-16 DIAGNOSIS — E785 Hyperlipidemia, unspecified: Secondary | ICD-10-CM

## 2023-03-16 DIAGNOSIS — L739 Follicular disorder, unspecified: Secondary | ICD-10-CM

## 2023-03-16 DIAGNOSIS — E7800 Pure hypercholesterolemia, unspecified: Secondary | ICD-10-CM

## 2023-03-16 DIAGNOSIS — H6690 Otitis media, unspecified, unspecified ear: Secondary | ICD-10-CM

## 2023-03-16 DIAGNOSIS — J449 Chronic obstructive pulmonary disease, unspecified: Secondary | ICD-10-CM

## 2023-03-16 DIAGNOSIS — D696 Thrombocytopenia, unspecified: Secondary | ICD-10-CM

## 2023-03-16 DIAGNOSIS — R7303 Prediabetes: Secondary | ICD-10-CM

## 2023-03-16 LAB — POCT GLYCOSYLATED HEMOGLOBIN (HGB A1C): HbA1c, POC (prediabetic range): 5.8 % (ref 5.7–6.4)

## 2023-03-16 MED ORDER — ALBUTEROL SULFATE HFA 108 (90 BASE) MCG/ACT IN AERS: 2.0000 | INHALATION_SPRAY | RESPIRATORY_TRACT | 1 refills | Status: DC | PRN

## 2023-03-16 MED ORDER — AMOXICILLIN-POT CLAVULANATE 875-125 MG PO TABS: 1.0000 | ORAL_TABLET | Freq: Two times a day (BID) | ORAL | 0 refills | Status: DC

## 2023-03-16 MED ORDER — AMOXICILLIN-POT CLAVULANATE 875-125 MG PO TABS
1.0000 | ORAL_TABLET | Freq: Two times a day (BID) | ORAL | 0 refills | Status: DC
Start: 2023-03-16 — End: 2023-03-16

## 2023-03-16 MED ORDER — ATORVASTATIN CALCIUM 40 MG PO TABS: 40.0000 mg | ORAL_TABLET | Freq: Every day | ORAL | 3 refills | Status: DC

## 2023-03-16 NOTE — Progress Notes (Signed)
Assessment & Plan:  Mathew Bridges was seen today for prediabetes.  Diagnoses and all orders for this visit:  Prediabetes -     CMP14+EGFR -     POCT glycosylated hemoglobin (Hb A1C)  Folliculitis -     Ambulatory referral to Dermatology  Hypercholesterolemia -     Lipid panel  Thrombocytopenia (HCC) -     CBC with Differential  COPD without exacerbation (HCC) -     albuterol (VENTOLIN HFA) 108 (90 Base) MCG/ACT inhaler; Inhale 2 puffs into the lungs every 4 (four) hours as needed for wheezing or shortness of breath.  Hyperlipidemia, unspecified hyperlipidemia type -     atorvastatin (LIPITOR) 40 MG tablet; Take 1 tablet (40 mg total) by mouth daily.  Acute otitis media, unspecified otitis media type -     amoxicillin-clavulanate (AUGMENTIN) 875-125 MG tablet; Take 1 tablet by mouth 2 (two) times daily. FOR EAR INFECTION    Patient has been counseled on age-appropriate routine health concerns for screening and prevention. These are reviewed and up-to-date. Referrals have been placed accordingly. Immunizations are up-to-date or declined.    Subjective:   Chief Complaint  Patient presents with   Prediabetes   HPI Mathew Bridges 62 y.o. male presents to office today for follow up to prediabetes.   He has a past medical history of COPD, Folliculitis, Prediabetes, and thrombocytopenia   Prediabetes Well controlled. He is not currently taking any medications. Controlled with diet.  Lab Results  Component Value Date   HGBA1C 5.8 03/16/2023       Requesting referral to dermatology for tender sores in his scalp that have not improved despite nizoral shampoo and bactroban ointment.     Cerumen impaction He notes difficulty hearing out of the right ear. He has been attempting to remove wax at home with OTC kits.  Upon exam there is cerumen impaction of the right ear and TM is unable to be visualized. Right ear was irrigated today by CMA. After irrigation of right ear there is  no wax impaction however there is visible blood present in the right TM. Mr Mathew Bridges denies any current pain in the ear.     Review of Systems  Constitutional:  Negative for fever, malaise/fatigue and weight loss.  HENT:  Positive for hearing loss. Negative for ear discharge, ear pain, nosebleeds and tinnitus.   Eyes: Negative.  Negative for blurred vision, double vision and photophobia.  Respiratory: Negative.  Negative for cough and shortness of breath.   Cardiovascular: Negative.  Negative for chest pain, palpitations and leg swelling.  Gastrointestinal: Negative.  Negative for heartburn, nausea and vomiting.  Musculoskeletal: Negative.  Negative for myalgias.  Neurological: Negative.  Negative for dizziness, focal weakness, seizures and headaches.  Psychiatric/Behavioral: Negative.  Negative for suicidal ideas.     Past Medical History:  Diagnosis Date   COPD (chronic obstructive pulmonary disease) (HCC)    Folliculitis    Prediabetes    Shortness of breath dyspnea    with exertion    Past Surgical History:  Procedure Laterality Date   CHOLECYSTECTOMY     INSERTION OF MESH N/A 03/22/2016   Procedure: INSERTION OF MESH;  Surgeon: Axel Filler, MD;  [facility];  Service: General;  Laterality: N/A;   VENTRAL HERNIA REPAIR N/A 03/22/2016   Procedure: LAPAROSCOPIC VENTRAL HERNIA;  Surgeon: Axel Filler, MD;  [facility];  Service: General;  Laterality: N/A;    Family History  Problem Relation Age of Onset  COPD Paternal Grandfather    Healthy Mother    Healthy Father     Social History Reviewed with no changes to be made today.   Outpatient Medications Prior to Visit  Medication Sig Dispense Refill   ketoconazole (NIZORAL) 2 % shampoo Apply 1 Application topically 2 (two) times a week. 120 mL 3   Multiple Vitamins-Minerals (CENTRUM MEN PO) Take 1 tablet by mouth daily.     mupirocin ointment (BACTROBAN) 2 % Apply topically to scalp 2 times per day 60 g 3    atorvastatin (LIPITOR) 40 MG tablet Take 1 tablet (40 mg total) by mouth daily. 90 tablet 3   naproxen (NAPROSYN) 500 MG tablet Take 1 tablet (500 mg total) by mouth 2 (two) times daily. Take with food. Do not take any additional NSAIDs with this medications (aleve, motrin, ibuprofen, aspirin) 30 tablet 0   albuterol (VENTOLIN HFA) 108 (90 Base) MCG/ACT inhaler Inhale 2 puffs into the lungs every 4 (four) hours as needed for wheezing or shortness of breath. (Patient not taking: Reported on 03/16/2023) 18 g 0   No facility-administered medications prior to visit.    No Known Allergies     Objective:    BP 116/68 (BP Location: Left Arm, Patient Position: Sitting, Cuff Size: Normal)   Pulse (!) 53   Ht 5\' 9"  (1.753 m)   Wt 165 lb (74.8 kg)   SpO2 97%   BMI 24.37 kg/m  Wt Readings from Last 3 Encounters:  03/16/23 165 lb (74.8 kg)  10/06/22 167 lb 4 oz (75.9 kg)  09/08/22 171 lb 9.6 oz (77.8 kg)    Physical Exam Vitals and nursing note reviewed.  Constitutional:      Appearance: He is well-developed.  HENT:     Head: Normocephalic and atraumatic.     Right Ear: No hemotympanum (after cerumen irrigated from ear).     Left Ear: Hearing, tympanic membrane, ear canal and external ear normal.  Cardiovascular:     Rate and Rhythm: Normal rate and regular rhythm.     Heart sounds: Normal heart sounds. No murmur heard.    No friction rub. No gallop.  Pulmonary:     Effort: Pulmonary effort is normal. No tachypnea or respiratory distress.     Breath sounds: Normal breath sounds. No decreased breath sounds, wheezing, rhonchi or rales.  Chest:     Chest wall: No tenderness.  Abdominal:     General: Bowel sounds are normal.     Palpations: Abdomen is soft.  Musculoskeletal:        General: Normal range of motion.     Cervical back: Normal range of motion.  Skin:    General: Skin is warm and dry.  Neurological:     Mental Status: He is alert and oriented to person, place, and time.      Coordination: Coordination normal.  Psychiatric:        Behavior: Behavior normal. Behavior is cooperative.        Thought Content: Thought content normal.        Judgment: Judgment normal.          Patient has been counseled extensively about nutrition and exercise as well as the importance of adherence with medications and regular follow-up. The patient was given clear instructions to go to ER or return to medical center if symptoms don't improve, worsen or new problems develop. The patient verbalized understanding.   Follow-up: Return in about 2 weeks (around 03/30/2023) for recheck  ear. may db 1110 03-30-2023.   Claiborne Rigg, FNP-BC [facility] and [facility] [place], [place] [phone]   03/16/2023, 11:39 AM

## 2023-03-17 LAB — CBC WITH DIFFERENTIAL/PLATELET
Basophils Absolute: 0 10*3/uL (ref 0.0–0.2)
Basos: 1 %
EOS (ABSOLUTE): 0.1 10*3/uL (ref 0.0–0.4)
Eos: 2 %
Hematocrit: 45 % (ref 37.5–51.0)
Hemoglobin: 15.2 g/dL (ref 13.0–17.7)
Immature Grans (Abs): 0 10*3/uL (ref 0.0–0.1)
Immature Granulocytes: 0 %
Lymphocytes Absolute: 1.4 10*3/uL (ref 0.7–3.1)
Lymphs: 26 %
MCH: 32.7 pg (ref 26.6–33.0)
MCHC: 33.8 g/dL (ref 31.5–35.7)
MCV: 97 fL (ref 79–97)
Monocytes Absolute: 0.6 10*3/uL (ref 0.1–0.9)
Monocytes: 11 %
Neutrophils Absolute: 3.1 10*3/uL (ref 1.4–7.0)
Neutrophils: 60 %
Platelets: 112 10*3/uL — ABNORMAL LOW (ref 150–450)
RBC: 4.65 x10E6/uL (ref 4.14–5.80)
RDW: 14.3 % (ref 11.6–15.4)
WBC: 5.2 10*3/uL (ref 3.4–10.8)

## 2023-03-17 LAB — CMP14+EGFR
ALT: 24 IU/L (ref 0–44)
AST: 20 IU/L (ref 0–40)
Albumin: 4.4 g/dL (ref 3.9–4.9)
Alkaline Phosphatase: 139 IU/L — ABNORMAL HIGH (ref 44–121)
BUN/Creatinine Ratio: 20 (ref 10–24)
BUN: 14 mg/dL (ref 8–27)
Bilirubin Total: 0.8 mg/dL (ref 0.0–1.2)
CO2: 23 mmol/L (ref 20–29)
Calcium: 9.2 mg/dL (ref 8.6–10.2)
Chloride: 103 mmol/L (ref 96–106)
Creatinine, Ser: 0.71 mg/dL — ABNORMAL LOW (ref 0.76–1.27)
Globulin, Total: 3 g/dL (ref 1.5–4.5)
Glucose: 117 mg/dL — ABNORMAL HIGH (ref 70–99)
Potassium: 3.8 mmol/L (ref 3.5–5.2)
Sodium: 142 mmol/L (ref 134–144)
Total Protein: 7.4 g/dL (ref 6.0–8.5)
eGFR: 104 mL/min/{1.73_m2} (ref >59)

## 2023-03-17 LAB — LIPID PANEL
Chol/HDL Ratio: 4.4 ratio (ref 0.0–5.0)
Cholesterol, Total: 158 mg/dL (ref 100–199)
HDL: 36 mg/dL — ABNORMAL LOW (ref >39)
LDL Chol Calc (NIH): 108 mg/dL — ABNORMAL HIGH (ref 0–99)
Triglycerides: 73 mg/dL (ref 0–149)
VLDL Cholesterol Cal: 14 mg/dL (ref 5–40)

## 2023-04-13 ENCOUNTER — Ambulatory Visit (INDEPENDENT_AMBULATORY_CARE_PROVIDER_SITE_OTHER): Payer: 59 | Admitting: Podiatry

## 2023-04-13 ENCOUNTER — Encounter: Payer: Self-pay | Admitting: Podiatry

## 2023-04-13 DIAGNOSIS — M79674 Pain in right toe(s): Secondary | ICD-10-CM | POA: Diagnosis not present

## 2023-04-13 DIAGNOSIS — D696 Thrombocytopenia, unspecified: Secondary | ICD-10-CM | POA: Diagnosis not present

## 2023-04-13 DIAGNOSIS — M79675 Pain in left toe(s): Secondary | ICD-10-CM

## 2023-04-13 DIAGNOSIS — L989 Disorder of the skin and subcutaneous tissue, unspecified: Secondary | ICD-10-CM | POA: Diagnosis not present

## 2023-04-13 DIAGNOSIS — B351 Tinea unguium: Secondary | ICD-10-CM

## 2023-04-13 NOTE — Progress Notes (Signed)
This patient presents to the office with chief complaint of long thick painful nails.  Patient says the nails are painful walking and wearing shoes.  This patient is unable to self treat.  This patient is unable to trim his nails since she is unable to reach his nails. He has painful callus on both forefeet which are painful when walking. he presents to the office for preventative foot care services.  General Appearance  Alert, conversant and in no acute stress.  Vascular  Dorsalis pedis and posterior tibial  pulses are palpable  bilaterally.  Capillary return is within normal limits  bilaterally. Temperature is within normal limits  bilaterally.  Neurologic  Senn-Weinstein monofilament wire test within normal limits  bilaterally. Muscle power within normal limits bilaterally.  Nails Thick disfigured discolored nails with subungual debris  from hallux to fifth toes bilaterally. No evidence of bacterial infection or drainage bilaterally.  Orthopedic  No limitations of motion  feet .  No crepitus or effusions noted.  HAV  B/L.  Skin  normotropic skin with no porokeratosis noted bilaterally.  No signs of infections or ulcers noted.   Callus sub 2,5 left foot.  Callus sub 3 right foot.  Onychomycosis  Nails  B/L.  Pain in right toes  Pain in left toes  Debridement of nails both feet followed trimming the nails with dremel tool.    RTC 3 months.   Helane Gunther DPM

## 2023-07-12 ENCOUNTER — Ambulatory Visit: Payer: Self-pay

## 2023-07-12 NOTE — Telephone Encounter (Signed)
Disposition noted. WIll forward to PCP as a FYI.

## 2023-07-12 NOTE — Telephone Encounter (Signed)
Chief Complaint: Rash Symptoms: Bumps and redness on the back of the head and back, itchy, sore Frequency: constant  Pertinent Negatives: Patient denies fever, pus, pain Disposition: [] ED /[x] Urgent Care (no appt availability in office) / [] Appointment(In office/virtual)/ []  [facility]/ [] Home Care/ [] Refused Recommended Disposition /[] [facility]/ []  Follow-up with PCP Additional Notes: Patient states he has had a rash on the back of his head and back for a couple of weeks now. The rash is itchy, red, sore and has bumps all over. Patient states he usually is treated with an antibiotic when this occurs. Patient stated he ran out of antibiotics and needs more. Advised patient last time Augmentin was ordered for him it was to treat an ear infection not a rash in June of 2024. Care advice was given and no appointments are available in the office this month. Resources were given for the mobile bus or urgent care. Patient stated he would go to Urgent Care but declined to schedule an appointment, patient will go to Urgent care as a walk-in.   Summary: requesting antibiotic   Patient's significant other, Dahl, has called requesting antibiotics (amoxicillin-clavulanate (AUGMENTIN) 875-125 MG tablet) for patient, she states it helps his head clear up. Patient is completely out. No appt's until November 6th. Back of his has broke out in bumps and antibiotics helps.  Patients callback # [phone]     Reason for Disposition  Localized rash present > 7 days  Answer Assessment - Initial Assessment Questions 1. APPEARANCE of RASH: "Describe the rash."      Bumps and red 2. LOCATION: "Where is the rash located?"      On my back and head  3. NUMBER: "How many spots are there?"      A lot I don't know 4. SIZE: "How big are the spots?" (Inches, centimeters or compare to size of a coin)      It's all over the back  5. ONSET: "When did the rash start?"      A couple weeks 6.  ITCHING: "Does the rash itch?" If Yes, ask: "How bad is the itch?"  (Scale 0-10; or none, mild, moderate, severe)     Moderate  7. PAIN: "Does the rash hurt?" If Yes, ask: "How bad is the pain?"  (Scale 0-10; or none, mild, moderate, severe)    - NONE (0): no pain    - MILD (1-3): doesn't interfere with normal activities     - MODERATE (4-7): interferes with normal activities or awakens from sleep     - SEVERE (8-10): excruciating pain, unable to do any normal activities     Mild pain  8. OTHER SYMPTOMS: "Do you have any other symptoms?" (e.g., fever)     No  Protocols used: Rash or Redness - Localized-A-AH

## 2023-07-20 ENCOUNTER — Ambulatory Visit: Payer: 59 | Admitting: Podiatry

## 2023-09-01 ENCOUNTER — Ambulatory Visit: Payer: Self-pay | Admitting: Physician Assistant

## 2023-09-08 ENCOUNTER — Ambulatory Visit: Payer: 59 | Attending: Nurse Practitioner | Admitting: Nurse Practitioner

## 2023-09-08 ENCOUNTER — Encounter: Payer: Self-pay | Admitting: Nurse Practitioner

## 2023-09-08 VITALS — BP 150/72 | HR 78 | Temp 98.0°F | Resp 20 | Ht 69.0 in | Wt 160.0 lb

## 2023-09-08 DIAGNOSIS — D696 Thrombocytopenia, unspecified: Secondary | ICD-10-CM

## 2023-09-08 DIAGNOSIS — Z0000 Encounter for general adult medical examination without abnormal findings: Secondary | ICD-10-CM

## 2023-09-08 DIAGNOSIS — E785 Hyperlipidemia, unspecified: Secondary | ICD-10-CM | POA: Diagnosis not present

## 2023-09-08 DIAGNOSIS — R030 Elevated blood-pressure reading, without diagnosis of hypertension: Secondary | ICD-10-CM

## 2023-09-08 DIAGNOSIS — R7303 Prediabetes: Secondary | ICD-10-CM

## 2023-09-08 MED ORDER — AMLODIPINE BESYLATE 5 MG PO TABS
5.0000 mg | ORAL_TABLET | Freq: Every day | ORAL | 1 refills | Status: DC
Start: 2023-09-08 — End: 2023-10-27

## 2023-09-08 NOTE — Patient Instructions (Addendum)
Placed in [facility] [address], [place], [place] [address] Ph# [phone] Fax [phone]  [facility] [address]  [place], [place] [address]  Phone: [phone]  Fax: [phone]

## 2023-09-08 NOTE — Progress Notes (Signed)
Assessment & Plan:  Diagnoses and all orders for this visit:  Encounter for annual physical exam -     CBC with Differential -     CMP14+EGFR -     Hemoglobin A1c -     Lipid panel  Thrombocytopenia (HCC) -     CBC with Differential  Prediabetes -     CMP14+EGFR -     Hemoglobin A1c  Dyslipidemia, goal LDL below 70 -     Lipid panel INSTRUCTIONS: Work on a low fat, heart healthy diet and participate in regular aerobic exercise program by working out at least 150 minutes per week; 5 days a week-30 minutes per day. Avoid red meat/beef/steak,  fried foods. junk foods, sodas, sugary drinks, unhealthy snacking, alcohol and smoking.  Drink at least 80 oz of water per day and monitor your carbohydrate intake daily.    Elevated blood pressure reading Follow-up for blood pressure recheck in 4 to 6 weeks. -     amLODipine (NORVASC) 5 MG tablet; Take 1 tablet (5 mg total) by mouth daily. Continue all antihypertensives as prescribed.  Reminded to bring in blood pressure log for follow  up appointment.  RECOMMENDATIONS: DASH/Mediterranean Diets are healthier choices for HTN.      Patient has been counseled on age-appropriate routine health concerns for screening and prevention. These are reviewed and up-to-date. Referrals have been placed accordingly. Immunizations are up-to-date or declined.    Subjective:  No chief complaint on file.   Mathew Bridges 62 y.o. male presents to office today for follow up for annual physical exam.   He has a past medical history of COPD, Folliculitis, Prediabetes, and thrombocytopenia   If he is still waiting to hear from dermatology however based on notes in the system it appears they tried to reach out to him with to no avail.  Today he was given the number for their office to schedule  He    HTN Blood pressure is slightly elevated.  He does continue to smoke cigarettes daily. BP Readings from Last 3 Encounters:  09/08/23 (!) 150/72  03/16/23  116/68  10/06/22 (!) 141/63     Review of Systems  Constitutional:  Negative for fever, malaise/fatigue and weight loss.  HENT: Negative.  Negative for nosebleeds.   Eyes: Negative.  Negative for blurred vision, double vision and photophobia.  Respiratory: Negative.  Negative for cough and shortness of breath.   Cardiovascular: Negative.  Negative for chest pain, palpitations and leg swelling.  Gastrointestinal: Negative.  Negative for heartburn, nausea and vomiting.  Genitourinary: Negative.   Musculoskeletal: Negative.  Negative for myalgias.  Skin:  Positive for rash.  Neurological: Negative.  Negative for dizziness, focal weakness, seizures and headaches.  Endo/Heme/Allergies: Negative.   Psychiatric/Behavioral: Negative.  Negative for suicidal ideas.     Past Medical History:  Diagnosis Date   COPD (chronic obstructive pulmonary disease) (HCC)    Folliculitis    Prediabetes    Shortness of breath dyspnea    with exertion    Past Surgical History:  Procedure Laterality Date   CHOLECYSTECTOMY     INSERTION OF MESH N/A 03/22/2016   Procedure: INSERTION OF MESH;  Surgeon: Axel Filler, MD;  Location: [facility];  Service: General;  Laterality: N/A;   VENTRAL HERNIA REPAIR N/A 03/22/2016   Procedure: LAPAROSCOPIC VENTRAL HERNIA;  Surgeon: Axel Filler, MD;  Location: [facility];  Service: General;  Laterality: N/A;    Family History  Problem Relation Age of Onset  COPD Paternal Grandfather    Healthy Mother    Healthy Father     Social History Reviewed with no changes to be made today.   Outpatient Medications Prior to Visit  Medication Sig Dispense Refill   albuterol (VENTOLIN HFA) 108 (90 Base) MCG/ACT inhaler Inhale 2 puffs into the lungs every 4 (four) hours as needed for wheezing or shortness of breath. 8 g 1   atorvastatin (LIPITOR) 40 MG tablet Take 1 tablet (40 mg total) by mouth daily. 90 tablet 3   ketoconazole (NIZORAL) 2 % shampoo Apply 1 Application topically  2 (two) times a week. 120 mL 3   Multiple Vitamins-Minerals (CENTRUM MEN PO) Take 1 tablet by mouth daily.     mupirocin ointment (BACTROBAN) 2 % Apply topically to scalp 2 times per day 60 g 3   amoxicillin-clavulanate (AUGMENTIN) 875-125 MG tablet Take 1 tablet by mouth 2 (two) times daily. FOR EAR INFECTION (Patient not taking: Reported on 09/08/2023) 20 tablet 0   No facility-administered medications prior to visit.    No Known Allergies     Objective:    BP (!) 150/72   Pulse 78   Temp 98 F (36.7 C) (Oral)   Resp 20   Ht 5\' 9"  (1.753 m)   Wt 160 lb (72.6 kg)   SpO2 98%   BMI 23.63 kg/m  Wt Readings from Last 3 Encounters:  09/08/23 160 lb (72.6 kg)  03/16/23 165 lb (74.8 kg)  10/06/22 167 lb 4 oz (75.9 kg)    Physical Exam Constitutional:      Appearance: He is well-developed.  HENT:     Head: Normocephalic and atraumatic.     Right Ear: Hearing, tympanic membrane, ear canal and external ear normal.     Left Ear: Hearing, tympanic membrane, ear canal and external ear normal.     Nose: Nose normal. No mucosal edema or rhinorrhea.     Right Turbinates: Not enlarged.     Left Turbinates: Not enlarged.     Mouth/Throat:     Lips: Pink.     Mouth: Mucous membranes are moist.     Dentition: No gingival swelling, dental abscesses or gum lesions.     Pharynx: Uvula midline.     Tonsils: No tonsillar exudate. 1+ on the right. 1+ on the left.  Eyes:     General: Lids are normal. No scleral icterus.    Extraocular Movements: Extraocular movements intact.     Conjunctiva/sclera: Conjunctivae normal.     Pupils: Pupils are equal, round, and reactive to light.  Neck:     Thyroid: No thyromegaly.     Trachea: No tracheal deviation.  Cardiovascular:     Rate and Rhythm: Normal rate and regular rhythm.     Heart sounds: Normal heart sounds. No murmur heard.    No friction rub. No gallop.  Pulmonary:     Effort: Pulmonary effort is normal. No respiratory distress.      Breath sounds: Normal breath sounds. No wheezing or rales.  Chest:     Chest wall: No mass or tenderness.  Breasts:    Right: No inverted nipple, mass, nipple discharge, skin change or tenderness.     Left: No inverted nipple, mass, nipple discharge, skin change or tenderness.  Abdominal:     General: Bowel sounds are normal. There is no distension.     Palpations: Abdomen is soft. There is no mass.     Tenderness: There is no abdominal tenderness.  There is no guarding or rebound.  Musculoskeletal:        General: No tenderness or deformity. Normal range of motion.     Cervical back: Normal range of motion and neck supple.  Lymphadenopathy:     Cervical: No cervical adenopathy.  Skin:    General: Skin is warm and dry.     Capillary Refill: Capillary refill takes less than 2 seconds.     Findings: No erythema.     Comments: Folliculitis in scalp and at base of neck.  Neurological:     Mental Status: He is alert and oriented to person, place, and time.     Cranial Nerves: No cranial nerve deficit.     Sensory: Sensation is intact.     Motor: No abnormal muscle tone.     Coordination: Coordination is intact. Coordination normal.     Gait: Gait is intact.     Deep Tendon Reflexes: Reflexes normal.     Reflex Scores:      Patellar reflexes are 1+ on the right side and 1+ on the left side. Psychiatric:        Attention and Perception: Attention normal.        Mood and Affect: Mood normal.        Speech: Speech normal.        Behavior: Behavior normal.        Thought Content: Thought content normal.        Judgment: Judgment normal.          Patient has been counseled extensively about nutrition and exercise as well as the importance of adherence with medications and regular follow-up. The patient was given clear instructions to go to ER or return to medical center if symptoms don't improve, worsen or new problems develop. The patient verbalized understanding.   Follow-up:  Return in 6 weeks (on 10/20/2023) for BP CHECK WITH LUKE.   Claiborne Rigg, FNP-BC [facility] [place], [place] [phone]   09/08/2023, 3:12 PM

## 2023-09-09 LAB — CMP14+EGFR
ALT: 19 [IU]/L (ref 0–44)
AST: 18 [IU]/L (ref 0–40)
Albumin: 4.4 g/dL (ref 3.9–4.9)
Alkaline Phosphatase: 136 [IU]/L — ABNORMAL HIGH (ref 44–121)
BUN/Creatinine Ratio: 12 (ref 10–24)
BUN: 9 mg/dL (ref 8–27)
Bilirubin Total: 0.6 mg/dL (ref 0.0–1.2)
CO2: 26 mmol/L (ref 20–29)
Calcium: 9.1 mg/dL (ref 8.6–10.2)
Chloride: 103 mmol/L (ref 96–106)
Creatinine, Ser: 0.75 mg/dL — ABNORMAL LOW (ref 0.76–1.27)
Globulin, Total: 2.8 g/dL (ref 1.5–4.5)
Glucose: 91 mg/dL (ref 70–99)
Potassium: 4.5 mmol/L (ref 3.5–5.2)
Sodium: 142 mmol/L (ref 134–144)
Total Protein: 7.2 g/dL (ref 6.0–8.5)
eGFR: 101 mL/min/{1.73_m2} (ref >59)

## 2023-09-09 LAB — HEMOGLOBIN A1C
Est. average glucose Bld gHb Est-mCnc: 120 mg/dL
Hgb A1c MFr Bld: 5.8 % — ABNORMAL HIGH (ref 4.8–5.6)

## 2023-09-09 LAB — CBC WITH DIFFERENTIAL/PLATELET
Basophils Absolute: 0 10*3/uL (ref 0.0–0.2)
Basos: 1 %
EOS (ABSOLUTE): 0.1 10*3/uL (ref 0.0–0.4)
Eos: 1 %
Hematocrit: 47.8 % (ref 37.5–51.0)
Hemoglobin: 16.2 g/dL (ref 13.0–17.7)
Immature Grans (Abs): 0 10*3/uL (ref 0.0–0.1)
Immature Granulocytes: 1 %
Lymphocytes Absolute: 2 10*3/uL (ref 0.7–3.1)
Lymphs: 30 %
MCH: 33.1 pg — ABNORMAL HIGH (ref 26.6–33.0)
MCHC: 33.9 g/dL (ref 31.5–35.7)
MCV: 98 fL — ABNORMAL HIGH (ref 79–97)
Monocytes Absolute: 0.6 10*3/uL (ref 0.1–0.9)
Monocytes: 9 %
Neutrophils Absolute: 3.9 10*3/uL (ref 1.4–7.0)
Neutrophils: 58 %
Platelets: 130 10*3/uL — ABNORMAL LOW (ref 150–450)
RBC: 4.89 x10E6/uL (ref 4.14–5.80)
RDW: 14.1 % (ref 11.6–15.4)
WBC: 6.6 10*3/uL (ref 3.4–10.8)

## 2023-09-09 LAB — LIPID PANEL
Chol/HDL Ratio: 3.8 {ratio} (ref 0.0–5.0)
Cholesterol, Total: 125 mg/dL (ref 100–199)
HDL: 33 mg/dL — ABNORMAL LOW (ref >39)
LDL Chol Calc (NIH): 62 mg/dL (ref 0–99)
Triglycerides: 177 mg/dL — ABNORMAL HIGH (ref 0–149)
VLDL Cholesterol Cal: 30 mg/dL (ref 5–40)

## 2023-09-29 ENCOUNTER — Ambulatory Visit (HOSPITAL_COMMUNITY)
Admission: EM | Admit: 2023-09-29 | Discharge: 2023-09-29 | Disposition: A | Discharge status: Home / Self Care | Payer: Self-pay | Attending: Physician Assistant | Admitting: Physician Assistant

## 2023-09-29 ENCOUNTER — Encounter (HOSPITAL_COMMUNITY): Payer: Self-pay

## 2023-09-29 DIAGNOSIS — S61011A Laceration without foreign body of right thumb without damage to nail, initial encounter: Secondary | ICD-10-CM

## 2023-09-29 DIAGNOSIS — Z23 Encounter for immunization: Secondary | ICD-10-CM

## 2023-09-29 MED ORDER — LIDOCAINE HCL (PF) 2 % IJ SOLN
INTRAMUSCULAR | Status: AC
  Filled 2023-09-29: qty 5

## 2023-09-29 MED ORDER — TETANUS-DIPHTH-ACELL PERTUSSIS 5-2.5-18.5 LF-MCG/0.5 IM SUSY
0.5000 mL | PREFILLED_SYRINGE | Freq: Once | INTRAMUSCULAR | Status: AC
  Administered 2023-09-29: 0.5 mL via INTRAMUSCULAR

## 2023-09-29 MED ORDER — TETANUS-DIPHTH-ACELL PERTUSSIS 5-2.5-18.5 LF-MCG/0.5 IM SUSY
PREFILLED_SYRINGE | INTRAMUSCULAR | Status: AC
  Filled 2023-09-29: qty 0.5

## 2023-09-29 NOTE — ED Provider Notes (Addendum)
 [facility]    CSN: [id number] Arrival date & time: 09/29/23  1711      History   Chief Complaint Chief Complaint  Patient presents with   Laceration    HPI Mathew Bridges is a 63 y.o. male.   Patient presents today for evaluation of laceration to his right thumb.  Reports that he works at [employer] and was cleaning a utensil when he cut his thumb.  He does not believe that this was soiled.  This occurred at approximately 4 PM and he went home and cleaned it.  He does not take any blood thinning medication.  He denies any significant pain.  He is unsure when his last tetanus was.  He is right-handed.  Denies any numbness or paresthesias.    Past Medical History:  Diagnosis Date   COPD (chronic obstructive pulmonary disease) (HCC)    Folliculitis    Prediabetes    Shortness of breath dyspnea    with exertion    Patient Active Problem List   Diagnosis Date Noted   Pain due to onychomycosis of toenails of both feet 04/13/2023   Thrombocytopenia (HCC) 10/06/2022   Skin lesion 02/20/2019   History of bronchitis 12/10/2014   Excess ear wax 12/10/2014    Past Surgical History:  Procedure Laterality Date   CHOLECYSTECTOMY     INSERTION OF MESH N/A 03/22/2016   Procedure: INSERTION OF MESH;  Surgeon: Lynda Leos, MD;  Location: [facility];  Service: General;  Laterality: N/A;   VENTRAL HERNIA REPAIR N/A 03/22/2016   Procedure: LAPAROSCOPIC VENTRAL HERNIA;  Surgeon: Lynda Leos, MD;  Location: [facility];  Service: General;  Laterality: N/A;       Home Medications    Prior to Admission medications   Medication Sig Start Date End Date Taking? Authorizing Provider  albuterol  (VENTOLIN  HFA) 108 (90 Base) MCG/ACT inhaler Inhale 2 puffs into the lungs every 4 (four) hours as needed for wheezing or shortness of breath. 03/16/23   Fleming, Zelda W, NP  amLODipine  (NORVASC ) 5 MG tablet Take 1 tablet (5 mg total) by mouth daily. 09/08/23   Fleming, Zelda W, NP   atorvastatin  (LIPITOR) 40 MG tablet Take 1 tablet (40 mg total) by mouth daily. 03/16/23   Fleming, Zelda W, NP  ketoconazole  (NIZORAL ) 2 % shampoo Apply 1 Application topically 2 (two) times a week. 05/06/22   Fleming, Zelda W, NP  Multiple Vitamins-Minerals (CENTRUM MEN PO) Take 1 tablet by mouth daily.    [provider]  mupirocin  ointment (BACTROBAN ) 2 % Apply topically to scalp 2 times per day 05/05/22   Theotis Haze LELON, NP    Family History Family History  Problem Relation Age of Onset   COPD Paternal Grandfather    Healthy Mother    Healthy Father     Social History Social History   Tobacco Use   Smoking status: Some Days    Current packs/day: 0.50    Average packs/day: 0.5 packs/day for 30.0 years (15.0 ttl pk-yrs)    Types: Cigarettes   Smokeless tobacco: Never  Vaping Use   Vaping status: Never Used  Substance Use Topics   Alcohol use: Yes    Comment: occassionally    Drug use: No     Allergies   Patient has no known allergies.   Review of Systems Review of Systems  Musculoskeletal:  Negative for arthralgias and myalgias.  Skin:  Positive for wound.  Neurological:  Negative for weakness and numbness.  Physical Exam Triage Vital Signs ED Triage Vitals [09/29/23 1845]  Encounter Vitals Group     BP (!) 141/69     Systolic BP Percentile      Diastolic BP Percentile      Pulse Rate 67     Resp 16     Temp 98.1 F (36.7 C)     Temp Source Oral     SpO2 96 %     Weight 169 lb (76.7 kg)     Height 5' 7 (1.702 m)     Head Circumference      Peak Flow      Pain Score 8     Pain Loc      Pain Education      Exclude from Growth Chart    No data found.  Updated Vital Signs BP (!) 141/69 (BP Location: Left Arm)   Pulse 67   Temp 98.1 F (36.7 C) (Oral)   Resp 16   Ht 5' 7 (1.702 m)   Wt 169 lb (76.7 kg)   SpO2 96%   BMI 26.47 kg/m   Visual Acuity Right Eye Distance:   Left Eye Distance:   Bilateral Distance:    Right Eye  Near:   Left Eye Near:    Bilateral Near:     Physical Exam Vitals reviewed.  Constitutional:      General: He is awake.     Appearance: Normal appearance. He is well-developed. He is not ill-appearing.     Comments: Very pleasant male appears stated age in no acute distress sitting comfortably in exam room  HENT:     Head: Normocephalic and atraumatic.  Cardiovascular:     Comments: Capillary refill within 2 seconds right thumb Pulmonary:     Effort: Pulmonary effort is normal. No tachypnea or respiratory distress.  Musculoskeletal:     Comments: Right thumb: Normal active range of motion.  Normal pincer grip strength.  Laceration noted distal finger pad without involvement of nail.  Finger is neurovascularly intact.  Skin:    Comments: Approximately 4.5 cm V-shaped laceration with radial edge of laceration measuring approximately 3 cm and ulnar edge of laceration measuring 1.5 cm.  No foreign body noted.  Neurological:     Mental Status: He is alert.  Psychiatric:        Behavior: Behavior is cooperative.      UC Treatments / Results  Labs (all labs ordered are listed, but only abnormal results are displayed) Labs Reviewed - No data to display  EKG   Radiology No results found.  Procedures Laceration Repair  Date/Time: 09/29/2023 7:38 PM  Performed by: Sherrell Rocky POUR, PA-C Authorized by: Sherrell Rocky POUR, PA-C   Consent:    Consent obtained:  Verbal   Consent given by:  Patient   Risks discussed:  Pain, poor wound healing, need for additional repair and infection   Alternatives discussed:  Referral Universal protocol:    Procedure explained and questions answered to patient or proxy's satisfaction: yes     Patient identity confirmed:  Verbally with patient Anesthesia:    Anesthesia method:  Local infiltration   Local anesthetic:  Lidocaine  1% w/o epi Laceration details:    Location:  Finger   Finger location:  R thumb   Length (cm):  4.5 Pre-procedure  details:    Preparation:  Patient was prepped and draped in usual sterile fashion Exploration:    Hemostasis achieved with:  Direct pressure   Wound exploration:  entire depth of wound visualized   Treatment:    Area cleansed with:  Chlorhexidine    Amount of cleaning:  Standard   Irrigation solution:  Tap water   Irrigation method:  Tap   Debridement:  None Skin repair:    Repair method:  Sutures   Suture size:  6-0   Suture material:  Prolene   Suture technique:  Simple interrupted   Number of sutures:  7 Approximation:    Approximation:  Close Repair type:    Repair type:  Simple Post-procedure details:    Dressing:  Non-adherent dressing and splint for protection   Procedure completion:  Tolerated  (including critical care time)  Medications Ordered in UC Medications  Tdap (BOOSTRIX ) injection 0.5 mL (0.5 mLs Intramuscular Given 09/29/23 1923)    Initial Impression / Assessment and Plan / UC Course  I have reviewed the triage vital signs and the nursing notes.  Pertinent labs & imaging results that were available during my care of the patient were reviewed by me and considered in my medical decision making (see chart for details).     Laceration was cleaned with chlorhexidine  in clinic revealing recent laceration with associated skin flap.  No indication for imaging as patient had no concern for foreign body.  7 sutures were placed to approximate skin flap but we discussed that there is a good chance that the skin flap is not viable.  He is to return in 10 to 14 days to have sutures removed.  We discussed signs/symptoms of infection that would warrant reevaluation.  Tetanus was updated.  He was provided a work excuse note.  Strict return precautions given to which he expressed understanding.  He was placed in a splint for comfort and support.  Final Clinical Impressions(s) / UC Diagnoses   Final diagnoses:  Laceration of right thumb without foreign body without damage to  nail, initial encounter     Discharge Instructions      We placed 7 sutures.  Keep the area clean with soap and water.  We updated your tetanus today.  Please return in 10 to 14 days to have the sutures removed.  If you have any signs of infection including redness, swelling, pain, fever, numbness, tingling you need to be seen immediately.     ED Prescriptions   None    PDMP not reviewed this encounter.   Sherrell Rocky POUR, PA-C 09/29/23 1939    RaspetRocky POUR, PA-C 09/29/23 1940

## 2023-09-29 NOTE — Discharge Instructions (Signed)
 We placed 7 sutures.  Keep the area clean with soap and water.  We updated your tetanus today.  Please return in 10 to 14 days to have the sutures removed.  If you have any signs of infection including redness, swelling, pain, fever, numbness, tingling you need to be seen immediately.

## 2023-09-29 NOTE — ED Triage Notes (Signed)
 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 Unsure of when he had his last tetanus vaccine.

## 2023-10-13 ENCOUNTER — Ambulatory Visit (HOSPITAL_COMMUNITY)
Admission: EM | Admit: 2023-10-13 | Discharge: 2023-10-13 | Disposition: A | Discharge status: Home / Self Care | Payer: 59

## 2023-10-13 DIAGNOSIS — Z4802 Encounter for removal of sutures: Secondary | ICD-10-CM | POA: Diagnosis not present

## 2023-10-13 NOTE — ED Notes (Signed)
Patient presenting for removal of 7 sutures from the right tip of the thumb. All sutures removed, Patient tolerated well.

## 2023-10-20 ENCOUNTER — Ambulatory Visit: Payer: 59 | Admitting: Pharmacist

## 2023-10-27 ENCOUNTER — Encounter: Payer: Self-pay | Admitting: Pharmacist

## 2023-10-27 ENCOUNTER — Ambulatory Visit: Payer: 59 | Attending: Family Medicine | Admitting: Pharmacist

## 2023-10-27 VITALS — BP 151/80 | HR 78

## 2023-10-27 DIAGNOSIS — L219 Seborrheic dermatitis, unspecified: Secondary | ICD-10-CM

## 2023-10-27 DIAGNOSIS — Z716 Tobacco abuse counseling: Secondary | ICD-10-CM | POA: Diagnosis not present

## 2023-10-27 DIAGNOSIS — I10 Essential (primary) hypertension: Secondary | ICD-10-CM

## 2023-10-27 MED ORDER — KETOCONAZOLE 2 % EX SHAM: 1.0000 | MEDICATED_SHAMPOO | CUTANEOUS | 3 refills | Status: DC

## 2023-10-27 MED ORDER — AMLODIPINE BESYLATE 10 MG PO TABS: 10.0000 mg | ORAL_TABLET | Freq: Every day | ORAL | 1 refills | Status: DC

## 2023-10-27 MED ORDER — NICOTINE POLACRILEX 4 MG MT LOZG: 4.0000 mg | LOZENGE | OROMUCOSAL | 0 refills | Status: DC | PRN

## 2023-10-27 NOTE — Progress Notes (Signed)
   S:     No chief complaint on file.  63 y.o. male who presents for hypertension evaluation, education, and management.  PMH is significant for hypertension, preDM, COPD, tobacco use.  Patient was referred and last seen by Primary Care Provider, Bertram Denver, on 09/08/2023. BP was 150/72 at that visit. He was started on amlodipine.    Today, patient arrives in good spirits and presents without assistance. Denies dizziness, headache, blurred vision, swelling. He is down to ~3-4 cigarettes a day and is interested in stopping. He smokes his first cigarette within 30 minutes of waking up. Has never tried anything to help with cessation. Interested in NRT.   Family/Social history:  -Tobacco: current 0.5 some day smoker  -Alcohol: none reported  -Family hx: no pertinent positives   Medication adherence reported. Patient has taken BP medications today.   Current antihypertensives include: amlodipine 5 mg daily  Reported home BP readings: none  Patient reported dietary habits: -Sodium: admits to eating fast food. Tries not to add salt to his food.  -Caffeine: no excessive use reported   Patient-reported exercise habits:  -Does not do much in terms of aerobic exercise.  O:  Vitals:   10/27/23 1455 10/27/23 1500  BP: (!) 151/79 (!) 151/80  Pulse: 77 78   Last 3 Office BP readings: BP Readings from Last 3 Encounters:  09/29/23 (!) 141/69  09/08/23 (!) 150/72  03/16/23 116/68    BMET    Component Value Date/Time   NA 142 09/08/2023 1509   K 4.5 09/08/2023 1509   CL 103 09/08/2023 1509   CO2 26 09/08/2023 1509   GLUCOSE 91 09/08/2023 1509   GLUCOSE 98 10/06/2022 1117   BUN 9 09/08/2023 1509   CREATININE 0.75 (L) 09/08/2023 1509   CREATININE 0.73 10/06/2022 1117   CREATININE 0.69 12/10/2014 1550   CALCIUM 9.1 09/08/2023 1509   GFRNONAA >60 10/06/2022 1117   GFRNONAA >89 12/10/2014 1550   GFRAA 111 03/26/2020 1105   GFRAA >89 12/10/2014 1550   Renal function: CrCl  cannot be calculated (Patient's most recent lab result is older than the maximum 21 days allowed.).  Clinical ASCVD: No  The ASCVD Risk score (Arnett DK, et al., 2019) failed to calculate for the following reasons:   The valid total cholesterol range is 130 to 320 mg/dL  Patient is participating in a Managed Medicaid Plan: No    A/P: Hypertension diagnosed currently uncontrolled on current medications. BP goal < 130/80 mmHg. Medication adherence appears to be appropriate.  -Increased dose of amlodipine to 10 mg daily.  -Patient educated on purpose, proper use, and potential adverse effects of amlodipine.  -F/u labs ordered - none -Counseled on lifestyle modifications for blood pressure control including reduced dietary sodium, increased exercise, adequate sleep. -Encouraged patient to check BP at home and bring log of readings to next visit. Counseled on proper use of home BP cuff.  -Tobacco cessation: spent 5 minutes counseling patient on tobacco cessation. He elects to try NRT via 4 mg lozenge as needed.   Results reviewed and written information provided.    Written patient instructions provided. Patient verbalized understanding of treatment plan.  Total time in face to face counseling 30 minutes.    Follow-up:  Pharmacist in 1 month.  Mathew Bridges, PharmD, [place], CPP Clinical Pharmacist [facility] [phone]

## 2023-11-04 ENCOUNTER — Telehealth: Payer: Self-pay | Admitting: Nurse Practitioner

## 2023-11-04 NOTE — Telephone Encounter (Signed)
 Contacted pt no vm to leave a message to confirm appt

## 2023-11-09 ENCOUNTER — Ambulatory Visit: Payer: 59 | Admitting: Nurse Practitioner

## 2023-11-22 NOTE — Progress Notes (Unsigned)
 S:     No chief complaint on file.  63 y.o. male who presents for hypertension evaluation, education, and management.  PMH is significant for hypertension, preDM, COPD, tobacco use.  Patient was referred and last seen by Primary Care Provider, Bertram Denver, on 09/08/2023. BP was 150/72 at that visit. He was started on amlodipine.    Last pharmacy visit was on 10/27/23. BP was 151/80. We increased amlodipine to 10 mg daily. Additionally, patient elected to try NRT via 4 mg lozenge as needed.   Today, patient arrives in good spirits and presents without assistance. Confesses to smoking 15 min prior to appointment. Reports to not starting lozenges because he is unsure how to take it. Denies dizziness, headache, blurred vision, swelling.   Family/Social history:  -Tobacco: 3-5 cigarettes/day  -Alcohol: none reported  -Family hx: no pertinent positives   Medication adherence reported. Patient has taken BP medications today.   Current antihypertensives include: amlodipine 10 mg daily Current anti hyperlipidemia medication include: atorvastatin 40 mg daily  Reported home BP readings: none reported. Does not have a bp cuff at home.  Patient reported dietary habits: -Sodium: admits to eating fast food. Tries not to add salt to his food.  -Caffeine: no excessive use reported   Patient-reported exercise habits:  -Does not do much in terms of aerobic exercise.  O:  Vitals:   11/24/23 1055  BP: 128/69  Pulse: (!) 55    Last 3 Office BP readings: BP Readings from Last 3 Encounters:  11/24/23 128/69  10/27/23 (!) 151/80  09/29/23 (!) 141/69   BMET    Component Value Date/Time   NA 142 09/08/2023 1509   K 4.5 09/08/2023 1509   CL 103 09/08/2023 1509   CO2 26 09/08/2023 1509   GLUCOSE 91 09/08/2023 1509   GLUCOSE 98 10/06/2022 1117   BUN 9 09/08/2023 1509   CREATININE 0.75 (L) 09/08/2023 1509   CREATININE 0.73 10/06/2022 1117   CREATININE 0.69 12/10/2014 1550   CALCIUM 9.1  09/08/2023 1509   GFRNONAA >60 10/06/2022 1117   GFRNONAA >89 12/10/2014 1550   GFRAA 111 03/26/2020 1105   GFRAA >89 12/10/2014 1550   Renal function: CrCl cannot be calculated (Patient's most recent lab result is older than the maximum 21 days allowed.).  Clinical ASCVD: No  The ASCVD Risk score (Arnett DK, et al., 2019) failed to calculate for the following reasons:   The valid total cholesterol range is 130 to 320 mg/dL  Patient is participating in a Managed Medicaid Plan: No   A/P: Hypertension diagnosed currently controlled on current medications. BP goal < 130/80 mmHg. Educated patient to avoid smoking 30 min prior to appointment. Medication adherence appears to be appropriate.  -Continued amlodipine to 10 mg daily.  -Patient educated on purpose, proper use, and potential adverse effects of amlodipine.  -Counseled on lifestyle modifications for blood pressure control including reduced dietary sodium, increased exercise, adequate sleep. -Encouraged patient to check BP at home and bring log of readings to next visit. Counseled on proper use of home BP cuff. -Counseled patient on administration for lozenges.   Results reviewed and written information provided.    Written patient instructions provided. Patient verbalized understanding of treatment plan.  Total time in face to face counseling 15 minutes.    Follow-up:  Pharmacist: 12/26/23  Seen By: Mathew Bridges, PharmD Candidate [employer]  Class of 2027  Mathew Bridges, PharmD, [place], CPP Clinical Pharmacist [facility] [phone]

## 2023-11-24 ENCOUNTER — Ambulatory Visit: Payer: 59 | Attending: Nurse Practitioner | Admitting: Pharmacist

## 2023-11-24 ENCOUNTER — Encounter: Payer: Self-pay | Admitting: Pharmacist

## 2023-11-24 VITALS — BP 128/69 | HR 55

## 2023-11-24 DIAGNOSIS — I10 Essential (primary) hypertension: Secondary | ICD-10-CM

## 2023-12-26 ENCOUNTER — Encounter: Payer: Self-pay | Admitting: Pharmacist

## 2023-12-26 ENCOUNTER — Ambulatory Visit: Payer: 59 | Attending: Nurse Practitioner | Admitting: Pharmacist

## 2023-12-26 DIAGNOSIS — I10 Essential (primary) hypertension: Secondary | ICD-10-CM

## 2023-12-26 MED ORDER — AMLODIPINE BESYLATE 10 MG PO TABS: 10.0000 mg | ORAL_TABLET | Freq: Every day | ORAL | 2 refills | Status: AC

## 2023-12-26 NOTE — Progress Notes (Signed)
   S:     No chief complaint on file.  63 y.o. male who presents for hypertension evaluation, education, and management.  PMH is significant for hypertension, preDM, COPD, tobacco use. Patient was referred and last seen by Primary Care Provider, Bertram Denver, on 09/08/2023. BP was 150/72 at that visit. He was started on amlodipine.    Last pharmacy visit was on 11/24/23 - BP looked good at that appt. We saw him in January as well increased dose of amlodipine  Today, patient arrives in good spirits and presents without assistance.Denies dizziness, headache, blurred vision, swelling.   Family/Social history:  -Tobacco: 3-5 cigarettes/day  -Alcohol: none reported  -Family hx: no pertinent positives   Medication adherence reported. Patient has taken BP medications today.   Current antihypertensives include: amlodipine 10 mg daily Current anti hyperlipidemia medication include: atorvastatin 40 mg daily  Reported home BP readings: none reported. Does not have a bp cuff at home.  Patient reported dietary habits: -Sodium: admits to eating fast food. Tries not to add salt to his food.  -Caffeine: no excessive use reported   Patient-reported exercise habits:  -Does not do much in terms of aerobic exercise.  O:  Vitals:   12/26/23 1114  BP: 116/67    Last 3 Office BP readings: BP Readings from Last 3 Encounters:  12/26/23 116/67  11/24/23 128/69  10/27/23 (!) 151/80   BMET    Component Value Date/Time   NA 142 09/08/2023 1509   K 4.5 09/08/2023 1509   CL 103 09/08/2023 1509   CO2 26 09/08/2023 1509   GLUCOSE 91 09/08/2023 1509   GLUCOSE 98 10/06/2022 1117   BUN 9 09/08/2023 1509   CREATININE 0.75 (L) 09/08/2023 1509   CREATININE 0.73 10/06/2022 1117   CREATININE 0.69 12/10/2014 1550   CALCIUM 9.1 09/08/2023 1509   GFRNONAA >60 10/06/2022 1117   GFRNONAA >89 12/10/2014 1550   GFRAA 111 03/26/2020 1105   GFRAA >89 12/10/2014 1550   Renal function: CrCl cannot be  calculated (Patient's most recent lab result is older than the maximum 21 days allowed.).  Clinical ASCVD: No  The ASCVD Risk score (Arnett DK, et al., 2019) failed to calculate for the following reasons:   The valid total cholesterol range is 130 to 320 mg/dL  Patient is participating in a Managed Medicaid Plan: No   A/P: Hypertension diagnosed currently controlled on current medications. BP goal < 130/80 mmHg. Educated patient to avoid smoking 30 min prior to appointment. Medication adherence appears to be appropriate.  -Continued amlodipine to 10 mg daily.  -Patient educated on purpose, proper use, and potential adverse effects of amlodipine.  -Counseled on lifestyle modifications for blood pressure control including reduced dietary sodium, increased exercise, adequate sleep. -Encouraged patient to check BP at home and bring log of readings to next visit. Counseled on proper use of home BP cuff. -Counseled patient on administration for lozenges.   Results reviewed and written information provided.    Written patient instructions provided. Patient verbalized understanding of treatment plan.  Total time in face to face counseling 15 minutes.    Follow-up:  Pharmacist: prn PCP: recommend follow-up in 2-3 months   Butch Penny, PharmD, [place], CPP Clinical Pharmacist [facility] [phone]

## 2024-01-11 ENCOUNTER — Ambulatory Visit: Payer: Self-pay | Admitting: Nurse Practitioner

## 2024-02-01 ENCOUNTER — Encounter: Payer: Self-pay | Admitting: Nurse Practitioner

## 2024-02-01 ENCOUNTER — Ambulatory Visit: Payer: Self-pay | Attending: Nurse Practitioner | Admitting: Nurse Practitioner

## 2024-02-01 VITALS — BP 138/65 | HR 54 | Resp 19 | Ht 67.0 in | Wt 158.0 lb

## 2024-02-01 DIAGNOSIS — R350 Frequency of micturition: Secondary | ICD-10-CM

## 2024-02-01 DIAGNOSIS — N2889 Other specified disorders of kidney and ureter: Secondary | ICD-10-CM

## 2024-02-01 DIAGNOSIS — E785 Hyperlipidemia, unspecified: Secondary | ICD-10-CM

## 2024-02-01 DIAGNOSIS — I10 Essential (primary) hypertension: Secondary | ICD-10-CM

## 2024-02-01 DIAGNOSIS — R7303 Prediabetes: Secondary | ICD-10-CM

## 2024-02-01 DIAGNOSIS — R911 Solitary pulmonary nodule: Secondary | ICD-10-CM

## 2024-02-01 DIAGNOSIS — Z1211 Encounter for screening for malignant neoplasm of colon: Secondary | ICD-10-CM

## 2024-02-01 MED ORDER — ATORVASTATIN CALCIUM 40 MG PO TABS: 40.0000 mg | ORAL_TABLET | Freq: Every day | ORAL | 3 refills | Status: AC

## 2024-02-01 NOTE — Progress Notes (Signed)
 Assessment & Plan:  Mathew Bridges was seen today for hypertension.  Diagnoses and all orders for this visit:  Primary hypertension -     Hemoglobin A1c  Colon cancer screening -     Fecal occult blood, imunochemical([facility])  Hyperlipidemia, unspecified hyperlipidemia type -     atorvastatin  (LIPITOR) 40 MG tablet; Take 1 tablet (40 mg total) by mouth daily.  Prediabetes -     Hemoglobin A1c -     Thyroid Panel With TSH -     CMP14+EGFR  Urine frequency -     PSA  Renal mass, left -     CT ABDOMEN PELVIS W WO CONTRAST; Future  Lung nodule -     CT Chest Wo Contrast; Future    Patient has been counseled on age-appropriate routine health concerns for screening and prevention. These are reviewed and up-to-date. Referrals have been placed accordingly. Immunizations are up-to-date or declined.    Subjective:   Chief Complaint  Patient presents with   Hypertension    Mathew Bridges 63 y.o. male presents to office today for follow-up to hypertension.  He has a past medical history of hypertension, hyperlipidemia, COPD, folliculitis, prediabetes and tobacco dependence  He is concerned about weight loss however upon review of confirmed weights he is only down 2 pounds from December until now.  There is an urgent care weight from January however this was self-reported and patient was not weighed at that visit.  He denies any loss of appetite.  He only drinks alcohol occasionally.  He is a smoker but does not qualify for lung cancer screening. He does have an abnormal CT of abdomen and pelvis showing lung nodule and renal mass. We will need to follow up with repeat imaging.    Declines all vaccines today   Blood pressure is well-controlled with amlodipine  10 mg daily. BP Readings from Last 3 Encounters:  02/01/24 138/65  12/26/23 116/67  11/24/23 128/69    Wt Readings from Last 3 Encounters:  02/01/24 158 lb (71.7 kg)  09/29/23 169 lb (76.7 kg)  09/08/23 160 lb  (72.6 kg)    Review of Systems  Constitutional:  Positive for weight loss. Negative for fever and malaise/fatigue.  HENT: Negative.  Negative for nosebleeds.   Eyes: Negative.  Negative for blurred vision, double vision and photophobia.  Respiratory: Negative.  Negative for cough and shortness of breath.   Cardiovascular: Negative.  Negative for chest pain, palpitations and leg swelling.  Gastrointestinal: Negative.  Negative for heartburn, nausea and vomiting.  Genitourinary:  Positive for frequency. Negative for dysuria, flank pain, hematuria and urgency.  Musculoskeletal: Negative.  Negative for myalgias.  Neurological: Negative.  Negative for dizziness, focal weakness, seizures and headaches.  Psychiatric/Behavioral: Negative.  Negative for suicidal ideas.     Past Medical History:  Diagnosis Date   COPD (chronic obstructive pulmonary disease) (HCC)    Folliculitis    Prediabetes    Shortness of breath dyspnea    with exertion    Past Surgical History:  Procedure Laterality Date   CHOLECYSTECTOMY     INSERTION OF MESH N/A 03/22/2016   Procedure: INSERTION OF MESH;  Surgeon: Shela Derby, MD;  Location: [facility];  Service: General;  Laterality: N/A;   VENTRAL HERNIA REPAIR N/A 03/22/2016   Procedure: LAPAROSCOPIC VENTRAL HERNIA;  Surgeon: Shela Derby, MD;  Location: [facility] OR;  Service: General;  Laterality: N/A;    Family History  Problem Relation Age of Onset   COPD Paternal  Grandfather    Healthy Mother    Healthy Father     Social History Reviewed with no changes to be made today.   Outpatient Medications Prior to Visit  Medication Sig Dispense Refill   albuterol  (VENTOLIN  HFA) 108 (90 Base) MCG/ACT inhaler Inhale 2 puffs into the lungs every 4 (four) hours as needed for wheezing or shortness of breath. 8 g 1   amLODipine  (NORVASC ) 10 MG tablet Take 1 tablet (10 mg total) by mouth daily. 90 tablet 2   ketoconazole  (NIZORAL ) 2 % shampoo Apply 1 Application topically  2 (two) times a week. 120 mL 3   Multiple Vitamins-Minerals (CENTRUM MEN PO) Take 1 tablet by mouth daily.     mupirocin  ointment (BACTROBAN ) 2 % Apply topically to scalp 2 times per day 60 g 3   nicotine  polacrilex (COMMIT) 4 MG lozenge Take 1 lozenge (4 mg total) by mouth as needed for smoking cessation. 100 tablet 0   atorvastatin  (LIPITOR) 40 MG tablet Take 1 tablet (40 mg total) by mouth daily. 90 tablet 3   No facility-administered medications prior to visit.    No Known Allergies     Objective:    BP 138/65 (BP Location: Left Arm, Patient Position: Sitting, Cuff Size: Normal)   Pulse (!) 54   Resp 19   Ht 5\' 7"  (1.702 m)   Wt 158 lb (71.7 kg)   SpO2 100%   BMI 24.75 kg/m  Wt Readings from Last 3 Encounters:  02/01/24 158 lb (71.7 kg)  09/29/23 169 lb (76.7 kg)  09/08/23 160 lb (72.6 kg)    Physical Exam Vitals and nursing note reviewed.  Constitutional:      Appearance: He is well-developed.  HENT:     Head: Normocephalic and atraumatic.  Cardiovascular:     Rate and Rhythm: Normal rate and regular rhythm.     Heart sounds: Normal heart sounds. No murmur heard.    No friction rub. No gallop.  Pulmonary:     Effort: Pulmonary effort is normal. No tachypnea or respiratory distress.     Breath sounds: Normal breath sounds. No decreased breath sounds, wheezing, rhonchi or rales.  Chest:     Chest wall: No tenderness.  Abdominal:     General: Bowel sounds are normal.     Palpations: Abdomen is soft.  Musculoskeletal:        General: Normal range of motion.     Cervical back: Normal range of motion.  Skin:    General: Skin is warm and dry.  Neurological:     Mental Status: He is alert and oriented to person, place, and time.     Coordination: Coordination normal.  Psychiatric:        Behavior: Behavior normal. Behavior is cooperative.        Thought Content: Thought content normal.        Judgment: Judgment normal.          Patient has been counseled  extensively about nutrition and exercise as well as the importance of adherence with medications and regular follow-up. The patient was given clear instructions to go to ER or return to medical center if symptoms don't improve, worsen or new problems develop. The patient verbalized understanding.   Follow-up: Return in about 3 months (around 05/03/2024).   Collins Dean, FNP-BC [facility] [place], [place] [phone]   02/01/2024, 12:11 PM

## 2024-02-01 NOTE — Progress Notes (Signed)
Unintentional weight loss

## 2024-02-02 LAB — CMP14+EGFR
ALT: 15 IU/L (ref 0–44)
AST: 13 IU/L (ref 0–40)
Albumin: 4.3 g/dL (ref 3.9–4.9)
Alkaline Phosphatase: 127 IU/L — ABNORMAL HIGH (ref 44–121)
BUN/Creatinine Ratio: 9 — ABNORMAL LOW (ref 10–24)
BUN: 9 mg/dL (ref 8–27)
Bilirubin Total: 0.8 mg/dL (ref 0.0–1.2)
CO2: 26 mmol/L (ref 20–29)
Calcium: 9 mg/dL (ref 8.6–10.2)
Chloride: 104 mmol/L (ref 96–106)
Creatinine, Ser: 0.98 mg/dL (ref 0.76–1.27)
Globulin, Total: 2.4 g/dL (ref 1.5–4.5)
Glucose: 84 mg/dL (ref 70–99)
Potassium: 4.2 mmol/L (ref 3.5–5.2)
Sodium: 143 mmol/L (ref 134–144)
Total Protein: 6.7 g/dL (ref 6.0–8.5)
eGFR: 87 mL/min/{1.73_m2} (ref >59)

## 2024-02-02 LAB — HEMOGLOBIN A1C
Est. average glucose Bld gHb Est-mCnc: 111 mg/dL
Hgb A1c MFr Bld: 5.5 % (ref 4.8–5.6)

## 2024-02-02 LAB — THYROID PANEL WITH TSH
Free Thyroxine Index: 1.9 (ref 1.2–4.9)
T3 Uptake Ratio: 29 % (ref 24–39)
T4, Total: 6.4 ug/dL (ref 4.5–12.0)
TSH: 1.17 u[IU]/mL (ref 0.450–4.500)

## 2024-02-02 LAB — PSA: Prostate Specific Ag, Serum: 2 ng/mL (ref 0.0–4.0)

## 2024-02-02 NOTE — Progress Notes (Signed)
 Unable to contact patient by phone.  Unable to leave voicemail. Letter sent.

## 2024-02-09 ENCOUNTER — Ambulatory Visit: Payer: Self-pay | Admitting: Nurse Practitioner

## 2024-02-23 ENCOUNTER — Ambulatory Visit: Payer: 59 | Admitting: Dermatology

## 2024-02-27 NOTE — Telephone Encounter (Signed)
 Copied from CRM [id number]. Topic: Clinical - Lab/Test Results >> Feb 27, 2024  8:43 AM Tiffany B wrote:  Reason for CRM: Patient received letter regarding to call back regarding his lab results, informed paint of his lab results and patient had no further questions.   Patient also wanted to give the PCP a heads up he will be dropping of his stool sample at the lab.

## 2024-04-09 ENCOUNTER — Telehealth: Payer: Self-pay | Admitting: Nurse Practitioner

## 2024-04-09 NOTE — Telephone Encounter (Signed)
 1s attempt contacted pt no vm to  leave a call back message to resch appt due to provider out of the office

## 2024-04-10 ENCOUNTER — Telehealth: Payer: Self-pay | Admitting: Nurse Practitioner

## 2024-04-10 NOTE — Telephone Encounter (Signed)
 2nd attempt call pt back to resch appt due to provider  of the office

## 2024-04-11 NOTE — Telephone Encounter (Signed)
 3rd and 4th attempt contacted pt both numbers on file to resch appt

## 2024-05-09 ENCOUNTER — Ambulatory Visit: Payer: Self-pay | Admitting: Nurse Practitioner

## 2024-05-16 ENCOUNTER — Ambulatory Visit: Payer: Self-pay | Attending: Nurse Practitioner | Admitting: Nurse Practitioner

## 2024-05-16 ENCOUNTER — Encounter: Payer: Self-pay | Admitting: Nurse Practitioner

## 2024-05-16 VITALS — BP 134/71 | HR 51 | Resp 20 | Ht 67.0 in | Wt 158.2 lb

## 2024-05-16 DIAGNOSIS — I10 Essential (primary) hypertension: Secondary | ICD-10-CM

## 2024-05-16 DIAGNOSIS — Z716 Tobacco abuse counseling: Secondary | ICD-10-CM

## 2024-05-16 DIAGNOSIS — J449 Chronic obstructive pulmonary disease, unspecified: Secondary | ICD-10-CM

## 2024-05-16 MED ORDER — ALBUTEROL SULFATE HFA 108 (90 BASE) MCG/ACT IN AERS: 2.0000 | INHALATION_SPRAY | RESPIRATORY_TRACT | 1 refills | Status: AC | PRN

## 2024-05-16 NOTE — Progress Notes (Signed)
 Assessment & Plan:  [name] was seen today for medical management of chronic issues.  Diagnoses and all orders for this visit:  Primary hypertension Continue amlodipine  as prescribed.  Reminded to bring in blood pressure log for follow  up appointment.  RECOMMENDATIONS: DASH/Mediterranean Diets are healthier choices for HTN.    COPD without exacerbation (HCC) -     albuterol  (VENTOLIN  HFA) 108 (90 Base) MCG/ACT inhaler; Inhale 2 puffs into the lungs every 4 (four) hours as needed for wheezing or shortness of breath. Wheezing present without dyspnea. - Renew inhaler prescription. - Use inhaler as needed for cough, wheezing, or shortness of breath.  Tobacco use Interested in quitting with pharmacological assistance.  Will send nicotine  lozenges to pharmacy - Encouraged smoking cessation. - Discussed potential pharmacological options for smoking cessation.  Patient has been counseled on age-appropriate routine health concerns for screening and prevention. These are reviewed and up-to-date. Referrals have been placed accordingly. Immunizations are up-to-date or declined.    Subjective:   Chief Complaint  Patient presents with   Medical Management of Chronic Issues    History of Present Illness Mathew Bridges is a 63 year old male who presents for a routine follow-up visit to manage HTN  He is feeling well overall with no new complaints. UTD on colonoscopy.   He has a history of hypertension and is currently taking medication to manage his blood pressure. Blood pressure is well-controlled and he continues to take amlodipine  10 mg daily. BP Readings from Last 3 Encounters:  05/16/24 134/71  02/01/24 138/65  12/26/23 116/67    He smokes and is struggling to quit. He is considering taking medication to aid in smoking cessation.  He experiences occasional wheezing and had an inhaler prescribed in June of last year. He uses the inhaler as needed for symptoms such as cough, wheezing,  or shortness of breath.  He is wheezing on exam today.  Recent lab results show normal prostate levels, glucose, liver enzymes, A1c, and thyroid  levels.    Review of Systems  Constitutional:  Negative for fever, malaise/fatigue and weight loss.  HENT: Negative.  Negative for nosebleeds.   Eyes: Negative.  Negative for blurred vision, double vision and photophobia.  Respiratory:  Positive for cough. Negative for shortness of breath.   Cardiovascular: Negative.  Negative for chest pain, palpitations and leg swelling.  Gastrointestinal: Negative.  Negative for heartburn, nausea and vomiting.  Musculoskeletal: Negative.  Negative for myalgias.  Neurological: Negative.  Negative for dizziness, focal weakness, seizures and headaches.  Psychiatric/Behavioral: Negative.  Negative for suicidal ideas.     Past Medical History:  Diagnosis Date   COPD (chronic obstructive pulmonary disease) (HCC)    Folliculitis    Prediabetes    Shortness of breath dyspnea    with exertion    Past Surgical History:  Procedure Laterality Date   CHOLECYSTECTOMY     INSERTION OF MESH N/A 03/22/2016   Procedure: INSERTION OF MESH;  Surgeon: Lynda Leos, MD;  Location: [facility];  Service: General;  Laterality: N/A;   VENTRAL HERNIA REPAIR N/A 03/22/2016   Procedure: LAPAROSCOPIC VENTRAL HERNIA;  Surgeon: Lynda Leos, MD;  Location: [facility] OR;  Service: General;  Laterality: N/A;    Family History  Problem Relation Age of Onset   COPD Paternal Grandfather    Healthy Mother    Healthy Father     Social History Reviewed with no changes to be made today.   Outpatient Medications Prior to Visit  Medication  Sig Dispense Refill   amLODipine  (NORVASC ) 10 MG tablet Take 1 tablet (10 mg total) by mouth daily. 90 tablet 2   atorvastatin  (LIPITOR) 40 MG tablet Take 1 tablet (40 mg total) by mouth daily. 90 tablet 3   ketoconazole  (NIZORAL ) 2 % shampoo Apply 1 Application topically 2 (two) times a week. 120 mL 3    Multiple Vitamins-Minerals (CENTRUM MEN PO) Take 1 tablet by mouth daily.     mupirocin  ointment (BACTROBAN ) 2 % Apply topically to scalp 2 times per day 60 g 3   nicotine  polacrilex (COMMIT) 4 MG lozenge Take 1 lozenge (4 mg total) by mouth as needed for smoking cessation. 100 tablet 0   albuterol  (VENTOLIN  HFA) 108 (90 Base) MCG/ACT inhaler Inhale 2 puffs into the lungs every 4 (four) hours as needed for wheezing or shortness of breath. 8 g 1   No facility-administered medications prior to visit.    No Known Allergies     Objective:    BP 134/71 (BP Location: Left Arm, Patient Position: Sitting, Cuff Size: Normal)   Pulse (!) 51   Resp 20   Ht 5' 7 (1.702 m)   Wt 158 lb 3.2 oz (71.8 kg)   SpO2 100%   BMI 24.78 kg/m  Wt Readings from Last 3 Encounters:  05/16/24 158 lb 3.2 oz (71.8 kg)  02/01/24 158 lb (71.7 kg)  09/29/23 169 lb (76.7 kg)    Physical Exam Vitals and nursing note reviewed.  Constitutional:      Appearance: He is well-developed.  HENT:     Head: Normocephalic and atraumatic.  Cardiovascular:     Rate and Rhythm: Regular rhythm. Bradycardia present.     Heart sounds: Normal heart sounds. No murmur heard.    No friction rub. No gallop.  Pulmonary:     Effort: Pulmonary effort is normal. No tachypnea or respiratory distress.     Breath sounds: Wheezing present. No decreased breath sounds, rhonchi or rales.  Chest:     Chest wall: No tenderness.  Musculoskeletal:        General: Normal range of motion.     Cervical back: Normal range of motion.  Skin:    General: Skin is warm and dry.  Neurological:     Mental Status: He is alert and oriented to person, place, and time.     Coordination: Coordination normal.  Psychiatric:        Behavior: Behavior normal. Behavior is cooperative.        Thought Content: Thought content normal.        Judgment: Judgment normal.          Patient has been counseled extensively about nutrition and exercise as well  as the importance of adherence with medications and regular follow-up. The patient was given clear instructions to go to ER or return to medical center if symptoms don't improve, worsen or new problems develop. The patient verbalized understanding.   Follow-up: Return in about 3 months (around 08/16/2024).   Haze LELON Servant, FNP-BC [facility] and [facility] [place], [place] [phone]   06/04/2024, 7:03 AM

## 2024-06-04 ENCOUNTER — Encounter: Payer: Self-pay | Admitting: Nurse Practitioner

## 2024-06-04 MED ORDER — NICOTINE POLACRILEX 4 MG MT LOZG: 4.0000 mg | LOZENGE | OROMUCOSAL | 0 refills | Status: DC | PRN

## 2024-08-22 ENCOUNTER — Ambulatory Visit: Payer: Self-pay | Admitting: Nurse Practitioner

## 2024-10-03 ENCOUNTER — Encounter: Payer: Self-pay | Admitting: Nurse Practitioner

## 2024-10-03 ENCOUNTER — Ambulatory Visit: Payer: Self-pay | Attending: Nurse Practitioner | Admitting: Nurse Practitioner

## 2024-10-03 VITALS — BP 126/70 | HR 62 | Ht 67.0 in | Wt 152.0 lb

## 2024-10-03 DIAGNOSIS — D696 Thrombocytopenia, unspecified: Secondary | ICD-10-CM

## 2024-10-03 DIAGNOSIS — L219 Seborrheic dermatitis, unspecified: Secondary | ICD-10-CM

## 2024-10-03 DIAGNOSIS — K4020 Bilateral inguinal hernia, without obstruction or gangrene, not specified as recurrent: Secondary | ICD-10-CM

## 2024-10-03 DIAGNOSIS — E781 Pure hyperglyceridemia: Secondary | ICD-10-CM

## 2024-10-03 DIAGNOSIS — Z716 Tobacco abuse counseling: Secondary | ICD-10-CM

## 2024-10-03 DIAGNOSIS — R7303 Prediabetes: Secondary | ICD-10-CM

## 2024-10-03 MED ORDER — NICOTINE POLACRILEX 4 MG MT LOZG: 4.0000 mg | LOZENGE | OROMUCOSAL | 0 refills | Status: AC | PRN

## 2024-10-03 MED ORDER — KETOCONAZOLE 2 % EX SHAM: 1.0000 | MEDICATED_SHAMPOO | CUTANEOUS | 3 refills | Status: AC

## 2024-10-03 NOTE — Progress Notes (Signed)
 "  Assessment & Plan:  Duward was seen today for rash.  Diagnoses and all orders for this visit:  Seborrheic dermatitis -     ketoconazole  (NIZORAL ) 2 % shampoo; Apply 1 Application topically 2 (two) times a week. -     Ambulatory referral to Dermatology  Bilateral inguinal hernia without obstruction or gangrene, recurrence not specified -     Ambulatory referral to General Surgery Purchase hernia belt over the counter  Prediabetes -     CMP14+EGFR -     Hemoglobin A1c  Encounter for tobacco use cessation counseling -     nicotine  polacrilex (COMMIT) 4 MG lozenge; Take 1 lozenge (4 mg total) by mouth as needed for smoking cessation.  Hypertriglyceridemia -     Lipid panel INSTRUCTIONS: Work on a low fat, heart healthy diet and participate in regular aerobic exercise program by working out at least 150 minutes per week; 5 days a week-30 minutes per day. Avoid red meat/beef/steak,  fried foods. junk foods, sodas, sugary drinks, unhealthy snacking, alcohol and smoking.  Drink at least 80 oz of water per day and monitor your carbohydrate intake daily.    Low platelet count -     CBC with Differential    Patient has been counseled on age-appropriate routine health concerns for screening and prevention. These are reviewed and up-to-date. Referrals have been placed accordingly. Immunizations are up-to-date or declined.    Subjective:   Chief Complaint  Patient presents with   Rash    Mathew Bridges 64 y.o. male presents to office today for rash on scalp and bilateral groin pain.  He has a past medical history of COPD, Folliculitis, Prediabetes, seborrheic dermatitis, thrombocytopenia, tobacco dependence.   Folliculitis He has chronic folliculitis and seborrheic dermatitis. He has small papules in the back of his head which are tender and pruritic.  Endorses pain after popping the bumps in the back of his head. Also notes flaking around the hairline   He has bilateral inguinal  hernias on exam today. Notes tenderness with palpation.    Review of Systems  Constitutional:  Negative for fever, malaise/fatigue and weight loss.  HENT: Negative.  Negative for nosebleeds.   Eyes: Negative.  Negative for blurred vision, double vision and photophobia.  Respiratory: Negative.  Negative for cough and shortness of breath.   Cardiovascular: Negative.  Negative for chest pain, palpitations and leg swelling.  Gastrointestinal: Negative.  Negative for heartburn, nausea and vomiting.  Musculoskeletal: Negative.  Negative for myalgias.  Skin:  Positive for rash.  Neurological: Negative.  Negative for dizziness, focal weakness, seizures and headaches.  Psychiatric/Behavioral: Negative.  Negative for suicidal ideas.     Past Medical History:  Diagnosis Date   COPD (chronic obstructive pulmonary disease) (HCC)    Folliculitis    Prediabetes    Shortness of breath dyspnea    with exertion    Past Surgical History:  Procedure Laterality Date   CHOLECYSTECTOMY     INSERTION OF MESH N/A 03/22/2016   Procedure: INSERTION OF MESH;  Surgeon: Lynda Leos, MD;  [facility]: [facility];  Service: General;  Laterality: N/A;   VENTRAL HERNIA REPAIR N/A 03/22/2016   Procedure: LAPAROSCOPIC VENTRAL HERNIA;  Surgeon: Lynda Leos, MD;  Location: [facility] OR;  Service: General;  Laterality: N/A;    Family History  Problem Relation Age of Onset   COPD Paternal Grandfather    Healthy Mother    Healthy Father     Social History Reviewed with no  changes to be made today.   Outpatient Medications Prior to Visit  Medication Sig Dispense Refill   albuterol  (VENTOLIN  HFA) 108 (90 Base) MCG/ACT inhaler Inhale 2 puffs into the lungs every 4 (four) hours as needed for wheezing or shortness of breath. 8 g 1   amLODipine  (NORVASC ) 10 MG tablet Take 1 tablet (10 mg total) by mouth daily. 90 tablet 2   atorvastatin  (LIPITOR) 40 MG tablet Take 1 tablet (40 mg total) by mouth daily. 90 tablet 3    Multiple Vitamins-Minerals (CENTRUM MEN PO) Take 1 tablet by mouth daily.     mupirocin  ointment (BACTROBAN ) 2 % Apply topically to scalp 2 times per day 60 g 3   ketoconazole  (NIZORAL ) 2 % shampoo Apply 1 Application topically 2 (two) times a week. 120 mL 3   nicotine  polacrilex (COMMIT) 4 MG lozenge Take 1 lozenge (4 mg total) by mouth as needed for smoking cessation. 100 tablet 0   No facility-administered medications prior to visit.    Allergies[1]     Objective:    BP 126/70 (BP Location: Left Arm, Patient Position: Sitting, Cuff Size: Normal)   Pulse 62   Ht 5' 7 (1.702 m)   Wt 152 lb (68.9 kg)   SpO2 98%   BMI 23.81 kg/m  Wt Readings from Last 3 Encounters:  10/03/24 152 lb (68.9 kg)  05/16/24 158 lb 3.2 oz (71.8 kg)  02/01/24 158 lb (71.7 kg)    Physical Exam Vitals and nursing note reviewed.  Constitutional:      Appearance: He is well-developed.  HENT:     Head: Normocephalic and atraumatic.  Cardiovascular:     Rate and Rhythm: Normal rate and regular rhythm.     Heart sounds: Normal heart sounds. No murmur heard.    No friction rub. No gallop.  Pulmonary:     Effort: Pulmonary effort is normal. No tachypnea or respiratory distress.     Breath sounds: Normal breath sounds. No decreased breath sounds, wheezing, rhonchi or rales.  Chest:     Chest wall: No tenderness.  Abdominal:     General: Bowel sounds are normal.     Palpations: Abdomen is soft.     Hernia: A hernia is present. Hernia is present in the left inguinal area and right inguinal area.  Musculoskeletal:        General: Normal range of motion.     Cervical back: Normal range of motion.  Skin:    General: Skin is warm and dry.     Findings: Rash present. Rash is crusting, papular and pustular.  Neurological:     Mental Status: He is alert and oriented to person, place, and time.     Coordination: Coordination normal.  Psychiatric:        Behavior: Behavior normal. Behavior is cooperative.         Thought Content: Thought content normal.        Judgment: Judgment normal.          Patient has been counseled extensively about nutrition and exercise as well as the importance of adherence with medications and regular follow-up. The patient was given clear instructions to go to ER or return to medical center if symptoms don't improve, worsen or new problems develop. The patient verbalized understanding.   Follow-up: Return in about 4 months (around 01/31/2025).   Haze LELON Servant, FNP-BC [facility] [place], [place] [phone]   10/03/2024, 3:30 PM     [  1] No Known Allergies  "

## 2024-10-04 LAB — CBC WITH DIFFERENTIAL/PLATELET
Basophils Absolute: 0.1 x10E3/uL (ref 0.0–0.2)
Basos: 1 %
EOS (ABSOLUTE): 0.1 x10E3/uL (ref 0.0–0.4)
Eos: 1 %
Hematocrit: 51.3 % — ABNORMAL HIGH (ref 37.5–51.0)
Hemoglobin: 17.3 g/dL (ref 13.0–17.7)
Immature Grans (Abs): 0 x10E3/uL (ref 0.0–0.1)
Immature Granulocytes: 0 %
Lymphocytes Absolute: 1.7 x10E3/uL (ref 0.7–3.1)
Lymphs: 24 %
MCH: 34 pg — ABNORMAL HIGH (ref 26.6–33.0)
MCHC: 33.7 g/dL (ref 31.5–35.7)
MCV: 101 fL — ABNORMAL HIGH (ref 79–97)
Monocytes Absolute: 0.6 x10E3/uL (ref 0.1–0.9)
Monocytes: 8 %
Neutrophils Absolute: 4.7 x10E3/uL (ref 1.4–7.0)
Neutrophils: 65 %
Platelets: 112 x10E3/uL — ABNORMAL LOW (ref 150–450)
RBC: 5.09 x10E6/uL (ref 4.14–5.80)
RDW: 15.1 % (ref 11.6–15.4)
WBC: 7.1 x10E3/uL (ref 3.4–10.8)

## 2024-10-04 LAB — CMP14+EGFR
ALT: 14 IU/L (ref 0–44)
AST: 16 IU/L (ref 0–40)
Albumin: 4.4 g/dL (ref 3.9–4.9)
Alkaline Phosphatase: 129 IU/L — ABNORMAL HIGH (ref 47–123)
BUN/Creatinine Ratio: 10 (ref 10–24)
BUN: 8 mg/dL (ref 8–27)
Bilirubin Total: 1.2 mg/dL (ref 0.0–1.2)
CO2: 26 mmol/L (ref 20–29)
Calcium: 9.4 mg/dL (ref 8.6–10.2)
Chloride: 101 mmol/L (ref 96–106)
Creatinine, Ser: 0.78 mg/dL (ref 0.76–1.27)
Globulin, Total: 2.9 g/dL (ref 1.5–4.5)
Glucose: 96 mg/dL (ref 70–99)
Potassium: 4.7 mmol/L (ref 3.5–5.2)
Sodium: 141 mmol/L (ref 134–144)
Total Protein: 7.3 g/dL (ref 6.0–8.5)
eGFR: 100 mL/min/1.73

## 2024-10-04 LAB — LIPID PANEL
Chol/HDL Ratio: 4.1 ratio (ref 0.0–5.0)
Cholesterol, Total: 171 mg/dL (ref 100–199)
HDL: 42 mg/dL
LDL Chol Calc (NIH): 101 mg/dL — ABNORMAL HIGH (ref 0–99)
Triglycerides: 157 mg/dL — ABNORMAL HIGH (ref 0–149)
VLDL Cholesterol Cal: 28 mg/dL (ref 5–40)

## 2024-10-04 LAB — HEMOGLOBIN A1C
Est. average glucose Bld gHb Est-mCnc: 111 mg/dL
Hgb A1c MFr Bld: 5.5 % (ref 4.8–5.6)

## 2024-10-09 ENCOUNTER — Ambulatory Visit: Payer: Self-pay | Admitting: Nurse Practitioner

## 2024-10-25 ENCOUNTER — Telehealth: Payer: Self-pay

## 2024-10-25 NOTE — Telephone Encounter (Signed)
 Copied from CRM [id number]. Topic: Clinical - Lab/Test Results >> Oct 24, 2024 10:59 AM Harlene ORN wrote: Reason for CRM: Nori [employer] to follow up on [employer] for lab appointment that had on 02/01/2024.  PHone: [phone] Invoice number: [id number]

## 2024-10-25 NOTE — Telephone Encounter (Signed)
 [facility]

## 2024-10-25 NOTE — Telephone Encounter (Signed)
 [facility] phone system is down, will return call at a later time.

## 2025-02-06 ENCOUNTER — Ambulatory Visit: Payer: Self-pay | Admitting: Nurse Practitioner
